# Patient Record
Sex: Male | Born: 1950
Health system: Southern US, Community
[De-identification: ages and names within clinical notes are randomized; demographics above are authoritative.]

## PROBLEM LIST (undated history)

## (undated) DIAGNOSIS — C649 Malignant neoplasm of unspecified kidney, except renal pelvis: Secondary | ICD-10-CM

## (undated) DIAGNOSIS — Z87820 Personal history of traumatic brain injury: Secondary | ICD-10-CM

## (undated) DIAGNOSIS — B182 Chronic viral hepatitis C: Secondary | ICD-10-CM

## (undated) DIAGNOSIS — I10 Essential (primary) hypertension: Secondary | ICD-10-CM

## (undated) DIAGNOSIS — M6281 Muscle weakness (generalized): Secondary | ICD-10-CM

## (undated) DIAGNOSIS — S062X9A Diffuse traumatic brain injury with loss of consciousness of unspecified duration, initial encounter: Secondary | ICD-10-CM

## (undated) DIAGNOSIS — Z1612 Extended spectrum beta lactamase (ESBL) resistance: Secondary | ICD-10-CM

## (undated) DIAGNOSIS — N390 Urinary tract infection, site not specified: Secondary | ICD-10-CM

## (undated) DIAGNOSIS — R41841 Cognitive communication deficit: Secondary | ICD-10-CM

## (undated) DIAGNOSIS — F329 Major depressive disorder, single episode, unspecified: Secondary | ICD-10-CM

## (undated) DIAGNOSIS — R310 Gross hematuria: Secondary | ICD-10-CM

## (undated) DIAGNOSIS — S062XAA Diffuse traumatic brain injury with loss of consciousness status unknown, initial encounter: Secondary | ICD-10-CM

## (undated) DIAGNOSIS — F1021 Alcohol dependence, in remission: Secondary | ICD-10-CM

## (undated) DIAGNOSIS — F17220 Nicotine dependence, chewing tobacco, uncomplicated: Secondary | ICD-10-CM

## (undated) DIAGNOSIS — E43 Unspecified severe protein-calorie malnutrition: Secondary | ICD-10-CM

## (undated) DIAGNOSIS — R1312 Dysphagia, oropharyngeal phase: Secondary | ICD-10-CM

## (undated) HISTORY — DX: Gross hematuria: R31.0

## (undated) HISTORY — DX: Muscle weakness (generalized): M62.81

## (undated) HISTORY — DX: Personal history of traumatic brain injury: Z87.820

## (undated) HISTORY — DX: Chronic viral hepatitis C: B18.2

## (undated) HISTORY — DX: Urinary tract infection, site not specified: N39.0

## (undated) HISTORY — DX: Malignant neoplasm of unspecified kidney, except renal pelvis: C64.9

## (undated) HISTORY — DX: Unspecified severe protein-calorie malnutrition: E43

## (undated) HISTORY — DX: Cognitive communication deficit: R41.841

## (undated) HISTORY — DX: Alcohol dependence, in remission: F10.21

## (undated) HISTORY — DX: Extended spectrum beta lactamase (ESBL) resistance: Z16.12

## (undated) HISTORY — DX: Nicotine dependence, chewing tobacco, uncomplicated: F17.220

## (undated) HISTORY — DX: Major depressive disorder, single episode, unspecified: F32.9

## (undated) HISTORY — DX: Dysphagia, oropharyngeal phase: R13.12

## (undated) HISTORY — DX: Essential (primary) hypertension: I10

## (undated) HISTORY — DX: Diffuse traumatic brain injury with loss of consciousness status unknown, initial encounter: S06.2XAA

## (undated) HISTORY — DX: Diffuse traumatic brain injury with loss of consciousness of unspecified duration, initial encounter: S06.2X9A

## (undated) HISTORY — PX: TESTICLE REMOVAL: SHX68

---

## 2010-09-09 ENCOUNTER — Inpatient Hospital Stay: Payer: Self-pay | Admitting: Psychiatry

## 2011-02-08 ENCOUNTER — Emergency Department: Payer: Self-pay | Admitting: Emergency Medicine

## 2012-02-01 DIAGNOSIS — F17200 Nicotine dependence, unspecified, uncomplicated: Secondary | ICD-10-CM | POA: Insufficient documentation

## 2013-02-12 ENCOUNTER — Emergency Department: Payer: Self-pay | Admitting: Emergency Medicine

## 2013-02-12 LAB — URINALYSIS, COMPLETE
Bacteria: NONE SEEN
Bilirubin,UR: NEGATIVE
Blood: NEGATIVE
Glucose,UR: NEGATIVE mg/dL (ref 0–75)
Leukocyte Esterase: NEGATIVE
Ph: 7 (ref 4.5–8.0)
Protein: NEGATIVE
RBC,UR: 1 /HPF (ref 0–5)
Specific Gravity: 1.01 (ref 1.003–1.030)
Squamous Epithelial: NONE SEEN
WBC UR: <1 /HPF (ref 0–5)

## 2014-11-16 DIAGNOSIS — B182 Chronic viral hepatitis C: Secondary | ICD-10-CM | POA: Insufficient documentation

## 2015-08-31 ENCOUNTER — Encounter: Payer: Self-pay | Admitting: Emergency Medicine

## 2015-08-31 ENCOUNTER — Emergency Department
Admission: EM | Admit: 2015-08-31 | Discharge: 2015-08-31 | Disposition: A | Discharge status: Home / Self Care | Payer: Medicaid Other | Attending: Emergency Medicine | Admitting: Emergency Medicine

## 2015-08-31 DIAGNOSIS — H6092 Unspecified otitis externa, left ear: Secondary | ICD-10-CM | POA: Diagnosis not present

## 2015-08-31 DIAGNOSIS — F17210 Nicotine dependence, cigarettes, uncomplicated: Secondary | ICD-10-CM | POA: Diagnosis not present

## 2015-08-31 DIAGNOSIS — H9202 Otalgia, left ear: Secondary | ICD-10-CM | POA: Diagnosis present

## 2015-08-31 MED ORDER — OXYCODONE-ACETAMINOPHEN 5-325 MG PO TABS: 1.0000 | ORAL_TABLET | ORAL | Status: DC | PRN

## 2015-08-31 MED ORDER — KETOROLAC TROMETHAMINE 60 MG/2ML IM SOLN
60.0000 mg | Freq: Once | INTRAMUSCULAR | Status: AC
  Administered 2015-08-31: 60 mg via INTRAMUSCULAR
  Filled 2015-08-31: qty 2

## 2015-08-31 MED ORDER — OXYCODONE-ACETAMINOPHEN 5-325 MG PO TABS
2.0000 | ORAL_TABLET | Freq: Once | ORAL | Status: AC
  Administered 2015-08-31: 2 via ORAL
  Filled 2015-08-31: qty 2

## 2015-08-31 MED ORDER — OFLOXACIN 0.3 % OP SOLN: 4.0000 [drp] | Freq: Two times a day (BID) | OPHTHALMIC | Status: DC

## 2015-08-31 NOTE — ED Notes (Signed)
Pt reports he has been having fevers at home and has been waking up wet from sweat for the past week. Pt able to ambulate independently to treatment room and is in NAD at this time.

## 2015-08-31 NOTE — Discharge Instructions (Signed)

## 2015-08-31 NOTE — ED Provider Notes (Signed)
[facility] Emergency Department Provider Note  ____________________________________________  Time seen: Approximately 11:45 AM  I have reviewed the triage vital signs and the nursing notes.   HISTORY  Chief Complaint Otalgia    HPI Keith Daniels is a 65 y.o. male is complaining of drainage coming from his left ear times one week. Patient states progressively getting worse having some congestion in his head difficulty hearing. Patient states he has not been swimming or develop. His like his whole face is hurting.   History reviewed. No pertinent past medical history.  There are no active problems to display for this patient.   History reviewed. No pertinent past surgical history.  Current Outpatient Rx  Name  Route  Sig  Dispense  Refill  . ofloxacin (OCUFLOX) 0.3 % ophthalmic solution   Left Ear   Place 4 drops into the left ear 2 (two) times daily.   5 mL   0   . oxyCODONE-acetaminophen (ROXICET) 5-325 MG tablet   Oral   Take 1-2 tablets by mouth every 4 (four) hours as needed for severe pain.   15 tablet   0     Allergies Review of patient's allergies indicates no known allergies.  No family history on file.  Social History Social History  Substance Use Topics  . Smoking status: Current Every Day Smoker -- 0.50 packs/day    Types: Cigarettes  . Smokeless tobacco: None  . Alcohol Use: Yes     Comment: occas.    Review of Systems Constitutional: No fever/chills ENT: Positive greenish discharge coming from left ear. Positive for left-sided facial pain. Cardiovascular: Denies chest pain. Respiratory: Denies shortness of breath. Musculoskeletal: Negative for back pain. Skin: Negative for rash. Neurological: Negative for headaches, focal weakness or numbness.  10-point ROS otherwise negative.  ____________________________________________   PHYSICAL EXAM:  VITAL SIGNS: ED Triage Vitals  Enc Vitals Group     BP 08/31/15  1051 142/109 mmHg     Pulse Rate 08/31/15 1050 61     Resp 08/31/15 1050 18     Temp 08/31/15 1050 98.9 F (37.2 C)     Temp Source 08/31/15 1050 Oral     SpO2 08/31/15 1050 99 %     Weight 08/31/15 1050 170 lb (77.111 kg)     Height 08/31/15 1050 6' (1.829 m)     Head Cir --      Peak Flow --      Pain Score 08/31/15 1050 10     Pain Loc --      Pain Edu? --      Excl. in [place]? --     Constitutional: Alert and oriented. Well appearing and in no acute distress. Head: Left ear with copious amounts of greenish discharge noted. Mouth/Throat: Mucous membranes are moist.  Oropharynx non-erythematous. Neck: No stridor.   Cardiovascular: Normal rate, regular rhythm. Grossly normal heart sounds.  Good peripheral circulation. Respiratory: Normal respiratory effort.  No retractions. Lungs CTAB. Musculoskeletal: No lower extremity tenderness nor edema.  No joint effusions. Neurologic:  Normal speech and language. No gross focal neurologic deficits are appreciated. No gait instability. Skin:  Skin is warm, dry and intact. No rash noted. Psychiatric: Mood and affect are normal. Speech and behavior are normal.  ____________________________________________   LABS (all labs ordered are listed, but only abnormal results are displayed)  Labs Reviewed - No data to display ____________________________________________  EKG   ____________________________________________  RADIOLOGY   ____________________________________________   PROCEDURES  Procedure(s)  performed: None  Critical Care performed: No  ____________________________________________   INITIAL IMPRESSION / ASSESSMENT AND PLAN / ED COURSE  Pertinent labs & imaging results that were available during my care of the patient were reviewed by me and considered in my medical decision making (see chart for details).  Acute otitis externa left ear. Ocuflox Rx drops given. Patient follow-up with PCP or return to the ER with any  worsening symptomology. ____________________________________________   FINAL CLINICAL IMPRESSION(S) / ED DIAGNOSES  Final diagnoses:  Otitis externa of left ear     This chart was dictated using voice recognition software/Dragon. Despite best efforts to proofread, errors can occur which can change the meaning. Any change was purely unintentional.   Arlyss Repress, PA-C 08/31/15 1346  Lavonia Drafts, MD 08/31/15 [phone]

## 2015-08-31 NOTE — ED Notes (Signed)
Congestion and pain in left ear.

## 2016-10-14 DIAGNOSIS — I10 Essential (primary) hypertension: Secondary | ICD-10-CM | POA: Insufficient documentation

## 2016-10-26 DIAGNOSIS — E870 Hyperosmolality and hypernatremia: Secondary | ICD-10-CM | POA: Insufficient documentation

## 2016-10-26 DIAGNOSIS — R9431 Abnormal electrocardiogram [ECG] [EKG]: Secondary | ICD-10-CM | POA: Insufficient documentation

## 2016-11-18 DIAGNOSIS — R410 Disorientation, unspecified: Secondary | ICD-10-CM | POA: Insufficient documentation

## 2016-11-25 DIAGNOSIS — S069X9A Unspecified intracranial injury with loss of consciousness of unspecified duration, initial encounter: Secondary | ICD-10-CM | POA: Insufficient documentation

## 2016-11-25 DIAGNOSIS — S069XAA Unspecified intracranial injury with loss of consciousness status unknown, initial encounter: Secondary | ICD-10-CM | POA: Insufficient documentation

## 2016-11-26 DIAGNOSIS — S069X9S Unspecified intracranial injury with loss of consciousness of unspecified duration, sequela: Secondary | ICD-10-CM

## 2016-11-26 DIAGNOSIS — F0280 Dementia in other diseases classified elsewhere without behavioral disturbance: Secondary | ICD-10-CM | POA: Insufficient documentation

## 2017-05-29 ENCOUNTER — Other Ambulatory Visit: Payer: Self-pay

## 2017-05-30 ENCOUNTER — Ambulatory Visit (INDEPENDENT_AMBULATORY_CARE_PROVIDER_SITE_OTHER): Payer: Medicare Other | Admitting: Gastroenterology

## 2017-05-30 ENCOUNTER — Encounter (INDEPENDENT_AMBULATORY_CARE_PROVIDER_SITE_OTHER): Payer: Self-pay

## 2017-05-30 ENCOUNTER — Ambulatory Visit: Payer: Medicaid Other | Admitting: Gastroenterology

## 2017-05-30 VITALS — BP 128/84 | HR 66 | Ht 72.0 in

## 2017-05-30 DIAGNOSIS — Z931 Gastrostomy status: Secondary | ICD-10-CM | POA: Diagnosis not present

## 2017-05-30 NOTE — Progress Notes (Signed)
Keith Daniels [address]  Keith Daniels  [place], [place] [address]  Main: [phone]  Fax: [phone]   Gastroenterology Consultation  Referring Provider:     [name], MD Primary Care Physician:  Keith Shivers, MD Primary Gastroenterologist:  Dr. [name] Reason for Consultation:    PEG tube removal        HPI:   Keith Daniels is a 67 y.o. y/o male referred for consultation & management  by Dr. [name], MD.  Patient sent from living facility, at [facility] health care, without proper records, and referred for PEG tube removal.  Patient is here with his brother.  I do not have any previous records about his PEG tube whatsoever.  There is no way of knowing why the PEG tube was placed, when it was placed, where it was placed, what specialty placed it.  I do not have any indication of why removal of the PEG tube is being recommended either.    These records need to be obtained and reviewed carefully, to determine how and when, and what specialty can appropriately move the PEG tube safely.  As per patient and family, the PEG tube was placed 3-4 months ago when he was in the hospital.  He they state he went there unconscious.  They do not know why he was unconscious at the time.  They state he is eating by mouth at the facility without difficulty, and that is why they are requesting PEG tube removal.  Past medical history: TBI, alcohol use   Prior to Admission medications   Medication Sig Start Date End Date Taking? Authorizing Provider  finasteride (PROSCAR) 5 MG tablet TK 1 T PO QD 07/17/14   [provider]  gabapentin (NEURONTIN) 400 MG capsule TK 1 C PO TID 08/01/14   [provider]  meloxicam (MOBIC) 7.5 MG tablet TK 1 T PO BID 07/24/14   [provider]  methocarbamol (ROBAXIN) 500 MG tablet TK 1 T PO TID PRN 10/05/14   [provider]  ofloxacin (OCUFLOX) 0.3 % ophthalmic solution Place 4 drops into the  left ear 2 (two) times daily. 08/31/15   Beers, [name], PA-C  oxyCODONE-acetaminophen (ROXICET) 5-325 MG tablet Take 1-2 tablets by mouth every 4 (four) hours as needed for severe pain. 08/31/15   Beers, [name], PA-C  tamsulosin (FLOMAX) 0.4 MG CAPS capsule TK 1 C PO QD 07/17/14   [provider]  tiotropium (SPIRIVA HANDIHALER) 18 MCG inhalation capsule INHALE CONTENTS OF 1 CAPSULE VIA HANDIHALER QD 10/05/14   [provider]    No family history on file.   Social History   Tobacco Use  . Smoking status: Current Every Day Smoker    Packs/day: 0.50    Types: Cigarettes  Substance Use Topics  . Alcohol use: Yes    Comment: occas.  . Drug use: Not on file    Allergies as of 05/30/2017  . (No Known Allergies)    Review of Systems:    All systems reviewed and negative except where noted in HPI.   Physical Exam:  BP 128/84   Pulse 66   Ht 6' (1.829 m)   BMI 23.06 kg/m  No LMP for male patient.   Psych:  Alert and cooperative. Normal mood and affect. General:   Alert,  Well-developed, well-nourished, pleasant and cooperative in NAD Head:  Normocephalic and atraumatic. Eyes:  Sclera clear, no icterus.   Conjunctiva pink. Ears:  Normal auditory acuity.  Nose:  No deformity, discharge, or lesions. Mouth:  No deformity or lesions,oropharynx pink & moist. Neck:  Supple; no masses or thyromegaly. Abdomen:  Normal bowel sounds.  No bruits.  Soft, non-tender and non-distended without masses, hepatosplenomegaly or hernias noted.  No guarding or rebound tenderness.    PEG tube in place, cylindrical fasteners in place alongside the PEG tube as well Msk:  Symmetrical without gross deformities. Good, equal movement & strength bilaterally. Pulses:  Normal pulses noted. Extremities:  No clubbing or edema.  No cyanosis. Psych:  Alert and cooperative. Normal mood and affect.   Labs: CBC No results found for: WBC, RBC, HGB, HCT, PLT, MCV, MCH, MCHC, RDW, LYMPHSABS,  MONOABS, EOSABS, BASOSABS CMP  No results found for: NA, K, CL, CO2, GLUCOSE, BUN, CREATININE, CALCIUM, PROT, ALBUMIN, AST, ALT, ALKPHOS, BILITOT, GFRNONAA, GFRAA  Imaging Studies: No results found.  Assessment and Plan:   Keith Daniels is a 67 y.o. y/o male has been referred for PEG tube removal without any records sent.  Prior to removal of any PEG tube, it is imperative, to review previous records about the PEG tube itself.  For example, when it was placed, where it was placed, who placed it, what kind of PEG tube it is, indication for placement, indication for removal.  Without this information, PEG tube removal is not recommended.  We do not have any of these records available to [place] at this time.  His paper records from his living facility was not sent with any of these records.  His electronic chart does not have any of this information under chart review or care everywhere, or the scanned chart.  This was discussed extensively with the patient and his family.  In addition, the PEG tube has fasteners placed around it, and it is not a type of PEG tube I place myself.  The PEG tube should be removed by the kind of specialty that placed it.  For example, if surgery to place the PEG tube, they should be the ones to remove it.  If GI placed a plastic tube, a GI physician can remove it.  If IR place a PEG tube, interventional radiology should then remove it.  This allows for safe removal and follow-up of the PEG tube site.   I have sent written recommendations to the facility, to ask them to review these records and refer the patient to the specific physician or specialty that placed the PEG tube in the first place.  I have requested them to send me these records as well, as I can assist in figuring this out as well.  Patient has previously been followed by [facility] clinic GI for hepatitis C.  He should follow-up with them, and this regard, in the near future as they were planning on  starting him on treatment and had initiated workup for this.   Above discussed with patient and family and they verbalized understanding.   Will await records  Dr Keith Daniels

## 2017-06-02 ENCOUNTER — Telehealth: Payer: Self-pay

## 2017-06-02 NOTE — Telephone Encounter (Signed)
Dr. Bonna Gains calls office and states she called [facility] today and pt has had peg tube removed. No further f/u needed.

## 2017-06-02 NOTE — Progress Notes (Signed)
I talked with Aldona Bar from the nursing home today. She states patient's PEG has been removed at the nursing home. She does now know by who. I have left a message with her requesting the physician at the facility to contact me and left my cell number with them. She said she will let them know. I have also told her that pt should follow up with [facility] clinic GI in regard to his Hep C treatment that they had planned previously. She said she will let the physician know as well.  I am trying to contact the physician to make these recommendations directly and have sent these recommendations in written form with the patient during his clinic visit as well.

## 2017-06-19 ENCOUNTER — Ambulatory Visit: Payer: Medicaid Other | Admitting: Gastroenterology

## 2018-01-23 ENCOUNTER — Ambulatory Visit (INDEPENDENT_AMBULATORY_CARE_PROVIDER_SITE_OTHER): Payer: Medicare Other | Admitting: Urology

## 2018-01-23 ENCOUNTER — Encounter: Payer: Self-pay | Admitting: Urology

## 2018-01-23 VITALS — BP 116/73 | HR 82

## 2018-01-23 DIAGNOSIS — R310 Gross hematuria: Secondary | ICD-10-CM | POA: Diagnosis not present

## 2018-01-23 LAB — URINALYSIS, COMPLETE
Bilirubin, UA: NEGATIVE
Glucose, UA: NEGATIVE
Nitrite, UA: POSITIVE — AB
PH UA: 6.5 (ref 5.0–7.5)
Specific Gravity, UA: 1.02 (ref 1.005–1.030)
Urobilinogen, Ur: 1 mg/dL (ref 0.2–1.0)

## 2018-01-23 LAB — MICROSCOPIC EXAMINATION
EPITHELIAL CELLS (NON RENAL): NONE SEEN /HPF (ref 0–10)
RBC, UA: >30 /hpf — ABNORMAL HIGH (ref 0–2)

## 2018-01-23 NOTE — Progress Notes (Signed)
01/23/2018 2:31 PM   Keith Daniels Post Aug 29, 1950 [id number]  Referring provider: [name], MD [place], [place] [address]  Chief Complaint  Patient presents with  . 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  He presented today without a family member or staff member present.  There is a handwritten consultation for evaluation of gross hematuria.  The patient states he has had blood in his urine for the past week.  He does complain of dysuria, frequency and urgency.  He was unable to give a voided specimen and was catheterized with return of grossly bloody urine.  Unable to discern if he has had prior urologic problems however he is on tamsulosin and finasteride.   PMH: No past medical history on file.  Surgical History: No past surgical history on file.  Home Medications:  Allergies as of 01/23/2018   No Known Allergies     Medication List        Accurate as of 01/23/18  2:31 PM. Always use your most recent med list.          diphenhydrAMINE 50 MG/ML injection Commonly known as:  BENADRYL Inject into the muscle.   divalproex 125 MG capsule Commonly known as:  DEPAKOTE SPRINKLE Take by mouth.   enoxaparin 40 MG/0.4ML injection Commonly known as:  LOVENOX Inject into the skin.   finasteride 5 MG tablet Commonly known as:  PROSCAR TK 1 T PO QD   gabapentin 400 MG capsule Commonly known as:  NEURONTIN TK 1 C PO TID   LORazepam 2 MG/ML injection Commonly known as:  ATIVAN Inject into the vein.   Melatonin 300 MCG Tabs Take by mouth.   meloxicam 7.5 MG tablet Commonly known as:  MOBIC TK 1 T PO BID   methocarbamol 500 MG tablet Commonly known as:  ROBAXIN TK 1 T PO TID PRN   nicotine 14 mg/24hr patch Commonly known as:  NICODERM CQ - dosed in mg/24 hours Place onto the skin.   ofloxacin 0.3 % ophthalmic solution Commonly known as:   OCUFLOX Place 4 drops into the left ear 2 (two) times daily.   oxyCODONE-acetaminophen 5-325 MG tablet Commonly known as:  PERCOCET/ROXICET Take 1-2 tablets by mouth every 4 (four) hours as needed for severe pain.   risperidone 3 MG disintegrating tablet Commonly known as:  RISPERDAL M-TABS Take by mouth.   SPIRIVA HANDIHALER 18 MCG inhalation capsule Generic drug:  tiotropium INHALE CONTENTS OF 1 CAPSULE VIA HANDIHALER QD   tamsulosin 0.4 MG Caps capsule Commonly known as:  FLOMAX TK 1 C PO QD       Allergies: No Known Allergies  Family History: No family history on file.  Social History:  reports that he has been smoking cigarettes. He has been smoking about 0.50 packs per day. He has never used smokeless tobacco. He reports that he drinks alcohol. He reports that he has current or past drug history.  ROS: UROLOGY Frequent Urination?: No Hard to postpone urination?: No Burning/pain with urination?: No Get up at night to urinate?: No Leakage of urine?: No Urine stream starts and stops?: No Trouble starting stream?: No Do you have to strain to urinate?: No Blood in urine?: Yes Urinary tract infection?: No Sexually transmitted disease?: No Injury to kidneys or bladder?: No Painful intercourse?: No Weak stream?: No Erection problems?: No Penile pain?: No  Gastrointestinal Nausea?: No Vomiting?: No Indigestion/heartburn?: No  Diarrhea?: No Constipation?: No  Constitutional Fever: No Night sweats?: No Weight loss?: No Fatigue?: No  Skin Skin rash/lesions?: No Itching?: No  Eyes Blurred vision?: No Double vision?: No  Ears/Nose/Throat Sore throat?: No Sinus problems?: No  Hematologic/Lymphatic Swollen glands?: No Easy bruising?: No  Cardiovascular Leg swelling?: No Chest pain?: No  Respiratory Cough?: No Shortness of breath?: No  Endocrine Excessive thirst?: No  Musculoskeletal Back pain?: No Joint pain?:  No  Neurological Headaches?: No Dizziness?: No  Psychologic Depression?: No Anxiety?: No  Physical Exam: BP 116/73 (BP Location: Left Arm, Patient Position: Sitting, Cuff Size: Normal)   Pulse 82   Constitutional:  Alert, No acute distress. HEENT: [place] AT, moist mucus membranes.  Trachea midline, no masses. Cardiovascular: No clubbing, cyanosis, or edema. Respiratory: Normal respiratory effort, no increased work of breathing. GI: Abdomen is soft, nontender, nondistended, no abdominal masses GU: No CVA tenderness Lymph: No cervical or inguinal lymphadenopathy. Skin: No rashes, bruises or suspicious lesions. Neurologic: Grossly intact, no focal deficits, moving all 4 extremities. Psychiatric: Normal mood and affect.   Assessment & Plan:   67 year old male with gross hematuria.  The catheterized urine was sent for culture.  Will wait on these results before further evaluation.  If negative would recommend CT urogram and cystoscopy.  If positive for infection would recommend renal ultrasound initially.   Abbie Sons, [place] [address], [place], [place] [address] [phone]

## 2018-02-05 ENCOUNTER — Telehealth: Payer: Self-pay | Admitting: Urology

## 2018-02-05 DIAGNOSIS — R310 Gross hematuria: Secondary | ICD-10-CM

## 2018-02-05 NOTE — Telephone Encounter (Signed)
Bimpe is a NP from [facility] calling for the results of the pts Urine Culture, Please advise Bimpe of results at [phone]. Thank you.

## 2018-02-07 NOTE — Telephone Encounter (Signed)
I called and spoke with Bimpe, the NP at [facility]. She is going to run a urine culture on the patient since our urine culture was not sent out. She will fax over results as soon as she has them for [place] to discuss.

## 2018-02-12 ENCOUNTER — Other Ambulatory Visit: Payer: Self-pay

## 2018-02-12 MED ORDER — CEFDINIR 300 MG PO CAPS: 300.0000 mg | ORAL_CAPSULE | Freq: Two times a day (BID) | ORAL | 0 refills | Status: AC

## 2018-02-12 NOTE — Telephone Encounter (Signed)
I called and spoke with Bimpe at [facility]. Per Dr. Bernardo Heater, pt was started on Omnicef 300mg  bid for 10 days.  I have informed Bimpe and faxed over the rx. Per your office notes, since pt had a positive infection, you wanted to do a renal ultrasound. I have placed the order, can you please sign.

## 2018-02-12 NOTE — Addendum Note (Signed)
Addended by: Garnette Gunner on: 02/12/2018 11:12 AM   Modules accepted: Orders

## 2018-02-20 ENCOUNTER — Ambulatory Visit
Admission: RE | Admit: 2018-02-20 | Discharge: 2018-02-20 | Disposition: A | Discharge status: Home / Self Care | Payer: Medicare Other | Source: Ambulatory Visit | Attending: Urology | Admitting: Urology

## 2018-02-20 DIAGNOSIS — R39198 Other difficulties with micturition: Secondary | ICD-10-CM | POA: Insufficient documentation

## 2018-02-20 DIAGNOSIS — R310 Gross hematuria: Secondary | ICD-10-CM | POA: Diagnosis present

## 2018-02-21 ENCOUNTER — Other Ambulatory Visit: Payer: Self-pay | Admitting: Urology

## 2018-02-21 ENCOUNTER — Telehealth: Payer: Self-pay

## 2018-02-21 DIAGNOSIS — R310 Gross hematuria: Secondary | ICD-10-CM

## 2018-02-21 NOTE — Telephone Encounter (Signed)
-----   Message from Abbie Sons, MD sent at 02/21/2018 12:28 PM EST ----- Renal ultrasound showed a possible renal mass and left hydronephrosis.  Recommend scheduling a CT urogram.  He has a traumatic brain injury and is at [place] [facility].  Order was entered.

## 2018-02-21 NOTE — Telephone Encounter (Signed)
[place] spoke with pt's nurse Anderson Malta informed her of the results below. Also had results faxed to [phone].

## 2018-03-09 ENCOUNTER — Ambulatory Visit
Admission: RE | Admit: 2018-03-09 | Discharge: 2018-03-09 | Disposition: A | Discharge status: Home / Self Care | Payer: Medicare Other | Source: Ambulatory Visit | Attending: Urology | Admitting: Urology

## 2018-03-09 DIAGNOSIS — R310 Gross hematuria: Secondary | ICD-10-CM | POA: Diagnosis present

## 2018-03-09 LAB — POCT I-STAT CREATININE: CREATININE: 1.3 mg/dL — AB (ref 0.61–1.24)

## 2018-03-09 MED ORDER — IOPAMIDOL (ISOVUE-300) INJECTION 61%
100.0000 mL | Freq: Once | INTRAVENOUS | Status: AC | PRN
  Administered 2018-03-09: 100 mL via INTRAVENOUS

## 2018-03-14 ENCOUNTER — Telehealth: Payer: Self-pay

## 2018-03-14 DIAGNOSIS — C642 Malignant neoplasm of left kidney, except renal pelvis: Secondary | ICD-10-CM

## 2018-03-14 NOTE — Telephone Encounter (Signed)
Spoke with Jenny Reichmann patient's POA he said he is coming to visit next week and could make an appointment then to discuss or you may call him at [phone]

## 2018-03-14 NOTE — Telephone Encounter (Signed)
-----   Message from Abbie Sons, MD sent at 03/13/2018  8:42 PM EST ----- This patient is a resident of [facility] and has a traumatic brain injury.  His CT scan was abnormal.  I need to try to get in touch with his healthcare power of attorney regarding the results.

## 2018-03-16 NOTE — Telephone Encounter (Signed)
Pt's POA, John, has some questions about pt's care.  Can you please contact him?

## 2018-03-16 NOTE — Telephone Encounter (Signed)
I spoke with patient's POA Cheri Guppy regarding his CT result.  He has a large soft tissue mass in the left renal pelvis that appears to involve the lower pole of the left kidney and consistent with extensive urothelial carcinoma.  There is extensive retroperitoneal lymphadenopathy the largest lymph node measuring 3.7 cm.  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  Based on his overall comorbidities I have recommended an oncology appointment for further evaluation and consideration of percutaneous lymph node biopsy by IR if feasible.  The option of ureteroscopy and biopsy of renal pelvic mass was also discussed.  He will most likely be presented at oncology case conference.  Referral was placed to medical oncology.

## 2018-04-03 ENCOUNTER — Inpatient Hospital Stay: Payer: Medicare Other

## 2018-04-03 ENCOUNTER — Other Ambulatory Visit: Payer: Self-pay

## 2018-04-03 ENCOUNTER — Inpatient Hospital Stay: Payer: Medicare Other | Attending: Oncology | Admitting: Oncology

## 2018-04-03 ENCOUNTER — Encounter: Payer: Self-pay | Admitting: Oncology

## 2018-04-03 VITALS — BP 134/86 | HR 70 | Temp 96.3°F | Resp 18

## 2018-04-03 DIAGNOSIS — Z87891 Personal history of nicotine dependence: Secondary | ICD-10-CM | POA: Diagnosis not present

## 2018-04-03 DIAGNOSIS — Z79899 Other long term (current) drug therapy: Secondary | ICD-10-CM | POA: Insufficient documentation

## 2018-04-03 DIAGNOSIS — D649 Anemia, unspecified: Secondary | ICD-10-CM | POA: Diagnosis not present

## 2018-04-03 DIAGNOSIS — N2889 Other specified disorders of kidney and ureter: Secondary | ICD-10-CM | POA: Diagnosis not present

## 2018-04-03 DIAGNOSIS — I700 Atherosclerosis of aorta: Secondary | ICD-10-CM | POA: Diagnosis not present

## 2018-04-03 DIAGNOSIS — Z87820 Personal history of traumatic brain injury: Secondary | ICD-10-CM | POA: Insufficient documentation

## 2018-04-03 DIAGNOSIS — R590 Localized enlarged lymph nodes: Secondary | ICD-10-CM

## 2018-04-03 DIAGNOSIS — F1021 Alcohol dependence, in remission: Secondary | ICD-10-CM | POA: Insufficient documentation

## 2018-04-03 DIAGNOSIS — R300 Dysuria: Secondary | ICD-10-CM | POA: Diagnosis not present

## 2018-04-03 DIAGNOSIS — I10 Essential (primary) hypertension: Secondary | ICD-10-CM | POA: Insufficient documentation

## 2018-04-03 DIAGNOSIS — F329 Major depressive disorder, single episode, unspecified: Secondary | ICD-10-CM | POA: Insufficient documentation

## 2018-04-03 DIAGNOSIS — D72819 Decreased white blood cell count, unspecified: Secondary | ICD-10-CM | POA: Insufficient documentation

## 2018-04-03 DIAGNOSIS — C642 Malignant neoplasm of left kidney, except renal pelvis: Secondary | ICD-10-CM | POA: Diagnosis present

## 2018-04-03 DIAGNOSIS — R319 Hematuria, unspecified: Secondary | ICD-10-CM | POA: Diagnosis not present

## 2018-04-03 LAB — CBC WITH DIFFERENTIAL/PLATELET
Abs Immature Granulocytes: 0 10*3/uL (ref 0.00–0.07)
Basophils Absolute: 0 10*3/uL (ref 0.0–0.1)
Basophils Relative: 1 %
Eosinophils Absolute: 0.1 10*3/uL (ref 0.0–0.5)
Eosinophils Relative: 5 %
HCT: 37.2 % — ABNORMAL LOW (ref 39.0–52.0)
Hemoglobin: 11.8 g/dL — ABNORMAL LOW (ref 13.0–17.0)
IMMATURE GRANULOCYTES: 0 %
Lymphocytes Relative: 32 %
Lymphs Abs: 0.9 10*3/uL (ref 0.7–4.0)
MCH: 27.4 pg (ref 26.0–34.0)
MCHC: 31.7 g/dL (ref 30.0–36.0)
MCV: 86.3 fL (ref 80.0–100.0)
Monocytes Absolute: 0.3 10*3/uL (ref 0.1–1.0)
Monocytes Relative: 11 %
NRBC: 0 % (ref 0.0–0.2)
Neutro Abs: 1.4 10*3/uL — ABNORMAL LOW (ref 1.7–7.7)
Neutrophils Relative %: 51 %
Platelets: 176 10*3/uL (ref 150–400)
RBC: 4.31 MIL/uL (ref 4.22–5.81)
RDW: 14.6 % (ref 11.5–15.5)
WBC: 2.7 10*3/uL — ABNORMAL LOW (ref 4.0–10.5)

## 2018-04-03 LAB — PROTIME-INR
INR: 1.08
PROTHROMBIN TIME: 13.9 s (ref 11.4–15.2)

## 2018-04-03 LAB — COMPREHENSIVE METABOLIC PANEL
ALT: 12 U/L (ref 0–44)
AST: 20 U/L (ref 15–41)
Albumin: 4 g/dL (ref 3.5–5.0)
Alkaline Phosphatase: 79 U/L (ref 38–126)
Anion gap: 7 (ref 5–15)
BUN: 12 mg/dL (ref 8–23)
CHLORIDE: 106 mmol/L (ref 98–111)
CO2: 28 mmol/L (ref 22–32)
Calcium: 10.9 mg/dL — ABNORMAL HIGH (ref 8.9–10.3)
Creatinine, Ser: 1.17 mg/dL (ref 0.61–1.24)
GFR calc Af Amer: >60 mL/min (ref >60)
GFR calc non Af Amer: >60 mL/min (ref >60)
Glucose, Bld: 133 mg/dL — ABNORMAL HIGH (ref 70–99)
Potassium: 4.1 mmol/L (ref 3.5–5.1)
Sodium: 141 mmol/L (ref 135–145)
Total Bilirubin: 0.5 mg/dL (ref 0.3–1.2)
Total Protein: 7.9 g/dL (ref 6.5–8.1)

## 2018-04-03 LAB — APTT: aPTT: 35 seconds (ref 24–36)

## 2018-04-03 LAB — LACTATE DEHYDROGENASE: LDH: 125 U/L (ref 98–192)

## 2018-04-03 NOTE — Progress Notes (Signed)
Patient here for initial visit. Brother present with patient. He states that he is on IV medication for UTI and ever since he started his appetite is gone. He is currently at [facility]. Medication reconciliation done through faclity [facility]

## 2018-04-03 NOTE — Progress Notes (Signed)
Hematology/Oncology Consult note [facility] [phone] [phone]   Patient Care Team: [name], MD as PCP - General (Family Medicine)  REFERRING PROVIDER: Dr.Stoiff CHIEF COMPLAINTS/REASON FOR VISIT:  Evaluation of renal mass. 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  Patient has history of traumatic brain injury long term resident at [facility]. History of HCV, HTN, Alcohol abuse, IVDU.  Former smoker.  Patient was evaluated by Urology Dr.Stoiff due to hematuria for the past few weeks, associated with dysuria, increased frequency and urgency. Patient was treated with Omnicef 300mg  BID x 10 days empirically. Urine culture later ESBL, microbiology results not available to me. Per [facility] records, patient has been on Imipenem 500mg  Q6 hours. Per patient, he is finished the course today.   02/20/2018 US renal showed Abnormal appearance of the lower pole of the right kidney suspicious for a mass either neoplastic or infectious. There is moderate hydronephrosis on the left. Renal protocol MRI would be a useful next imaging step. Normal appearing right kidney. The urinary bladder has a small postvoid residual volume.  03/09/2018 CT hematuria work up showed 1. Large mass in the left renal pelvis involving the lower and interpolar regions of the left kidney, with some invasion of the renal parenchyma in the lower pole of the left kidney, highly concerning for primary urothelial neoplasm. This is associated with extensive retroperitoneal lymphadenopathy, most evident in the left para-aortic nodal station adjacent to the left renal hilum, as above. Filling defects in the urinary bladder discussed above, favored to represent clots, although additional foci of  urothelial neoplasm is not entirely excluded. 2. Aortic atherosclerosis. 3. Additional incidental findings, as above.  Patient was referred to cancer center for further evaluation.  Patient reports feeling weak and fatigue for past few months. His has poor appetite and has had weight loss. Also reports generalized pain, not able to verbalize details.  Accompanied by brother Enid Derry who is his POA  Review of Systems  Constitutional: Positive for fatigue. Negative for appetite change, chills, fever and unexpected weight change.  HENT:   Negative for hearing loss and voice change.   Eyes: Negative for eye problems and icterus.  Respiratory: Negative for chest tightness, cough and shortness of breath.   Cardiovascular: Negative for chest pain and leg swelling.  Gastrointestinal: Negative for abdominal distention and abdominal pain.  Endocrine: Negative for hot flashes.  Genitourinary: Negative for difficulty urinating and dysuria.        Left flank discomfort  Musculoskeletal: Negative for arthralgias.  Skin: Negative for itching and rash.  Neurological: Negative for light-headedness and numbness.  Hematological: Negative for adenopathy. Does not bruise/bleed easily.  Psychiatric/Behavioral: Negative for confusion.    MEDICAL HISTORY:  Past Medical History:  Diagnosis Date  . Alcohol dependence in remission ([place])   . Chewing tobacco nicotine dependence, uncomplicated   . Chronic viral hepatitis C ([place])   . Cognitive communication deficit   . Diffuse traumatic brain injury ([place])   . Dysphagia, oropharyngeal phase   . Essential hypertension   . Extended spectrum beta lactamase (ESBL) resistance   . Gross hematuria   . History of traumatic brain injury   . Major depressive disorder   . Muscle weakness (generalized)   . Unspecified severe protein-calorie malnutrition ([place])   . Urinary  tract infection, site not specified     SURGICAL HISTORY: History reviewed. No pertinent surgical  history.  SOCIAL HISTORY: Social History   Socioeconomic History  . Marital status: Single    Spouse name: Not on file  . Number of children: Not on file  . Years of education: Not on file  . Highest education level: Not on file  Occupational History  . Not on file  Social Needs  . Financial resource strain: Not on file  . Food insecurity:    Worry: Not on file    Inability: Not on file  . Transportation needs:    Medical: Not on file    Non-medical: Not on file  Tobacco Use  . Smoking status: Former Smoker    Packs/day: 1.00    Years: 10.00    Pack years: 10.00    Types: Cigarettes    Last attempt to quit: 04/03/2017    Years since quitting: 1.0  . Smokeless tobacco: Never Used  Substance and Sexual Activity  . Alcohol use: Not Currently    Comment: quit in 2019  . Drug use: Not Currently    Types: "Crack" cocaine    Comment: quit in 2019  . Sexual activity: Yes  Lifestyle  . Physical activity:    Days per week: Not on file    Minutes per session: Not on file  . Stress: Not on file  Relationships  . Social connections:    Talks on phone: Not on file    Gets together: Not on file    Attends religious service: Not on file    Active member of club or organization: Not on file    Attends meetings of clubs or organizations: Not on file    Relationship status: Not on file  . Intimate partner violence:    Fear of current or ex partner: Not on file    Emotionally abused: Not on file    Physically abused: Not on file    Forced sexual activity: Not on file  Other Topics Concern  . Not on file  Social History Narrative  . Not on file    FAMILY HISTORY: Family History  Problem Relation Age of Onset  . Kidney disease Mother   . Hypertension Brother   . Prostate cancer Brother     ALLERGIES:  has No Known Allergies.  MEDICATIONS:  Current Outpatient Medications  Medication Sig Dispense Refill  . escitalopram (LEXAPRO) 10 MG tablet Take 10 mg by mouth daily.     [name] imipenem-cilastatin (PRIMAXIN) 500 MG injection Inject 500 mg into the vein every 6 (six) hours.    . Melatonin 300 MCG TABS Take 1.5 tablets by mouth at bedtime.     . MULTIPLE VITAMIN PO Take 1 tablet by mouth daily.    [name] thiamine (VITAMIN B-1) 100 MG tablet Take 100 mg by mouth daily.    . traMADol (ULTRAM) 50 MG tablet Take by mouth every 12 (twelve) hours as needed.    . diphenhydrAMINE (BENADRYL) 50 MG/ML injection Inject into the muscle.    . divalproex (DEPAKOTE SPRINKLE) 125 MG capsule Take by mouth.    . enoxaparin (LOVENOX) 40 MG/0.4ML injection Inject into the skin.    . finasteride (PROSCAR) 5 MG tablet TK 1 T PO QD    . gabapentin (NEURONTIN) 400 MG capsule TK 1 C PO TID    . LORazepam (ATIVAN) 2 MG/ML injection Inject into the vein.    . meloxicam (MOBIC) 7.5 MG  tablet TK 1 T PO BID    . methocarbamol (ROBAXIN) 500 MG tablet TK 1 T PO TID PRN    . nicotine (NICODERM CQ - DOSED IN MG/24 HOURS) 14 mg/24hr patch Place onto the skin.    [name] ofloxacin (OCUFLOX) 0.3 % ophthalmic solution Place 4 drops into the left ear 2 (two) times daily. (Patient not taking: Reported on 04/03/2018) 5 mL 0  . oxyCODONE-acetaminophen (ROXICET) 5-325 MG tablet Take 1-2 tablets by mouth every 4 (four) hours as needed for severe pain. (Patient not taking: Reported on 04/03/2018) 15 tablet 0  . risperidone (RISPERDAL M-TABS) 3 MG disintegrating tablet Take by mouth.    . tamsulosin (FLOMAX) 0.4 MG CAPS capsule TK 1 C PO QD    . tiotropium (SPIRIVA HANDIHALER) 18 MCG inhalation capsule INHALE CONTENTS OF 1 CAPSULE VIA HANDIHALER QD     No current facility-administered medications for this visit.      PHYSICAL EXAMINATION: ECOG PERFORMANCE STATUS: 3 - Symptomatic, >50% confined to bed Vitals:   04/03/18 1120  BP: 134/86  Pulse: 70  Resp: 18  Temp: (!) 96.3 F (35.7 C)   Filed Weights    Physical Exam Constitutional:      General: He is not in acute distress.    Appearance: He is  ill-appearing.  HENT:     Head: Normocephalic.  Eyes:     General: No scleral icterus.    Pupils: Pupils are equal, round, and reactive to light.  Neck:     Musculoskeletal: Normal range of motion and neck supple.  Cardiovascular:     Rate and Rhythm: Normal rate and regular rhythm.     Heart sounds: Normal heart sounds.  Pulmonary:     Effort: Pulmonary effort is normal. No respiratory distress.     Breath sounds: No wheezing.  Abdominal:     General: Bowel sounds are normal. There is no distension.     Palpations: Abdomen is soft. There is no mass.  Musculoskeletal: Normal range of motion.        General: No deformity.     Comments: Decreased bilateral lower extremity muscle strength. 2-3/5  Skin:    General: Skin is warm and dry.     Findings: No erythema or rash.  Neurological:     Mental Status: He is alert and oriented to person, place, and time.     Cranial Nerves: No cranial nerve deficit.     Coordination: Coordination normal.  Psychiatric:        Behavior: Behavior normal.        Thought Content: Thought content normal.      LABORATORY DATA:  I have reviewed the data as listed Lab Results  Component Value Date   WBC 2.7 (L) 04/03/2018   HGB 11.8 (L) 04/03/2018   HCT 37.2 (L) 04/03/2018   MCV 86.3 04/03/2018   PLT 176 04/03/2018   Recent Labs    03/09/18 1353 04/03/18 1206  NA  --  141  K  --  4.1  CL  --  106  CO2  --  28  GLUCOSE  --  133*  BUN  --  12  CREATININE 1.30* 1.17  CALCIUM  --  10.9*  GFRNONAA  --  >60  GFRAA  --  >60  PROT  --  7.9  ALBUMIN  --  4.0  AST  --  20  ALT  --  12  ALKPHOS  --  79  BILITOT  --  0.5   Iron/TIBC/Ferritin/ %Sat No results found for: IRON, TIBC, FERRITIN, IRONPCTSAT      ASSESSMENT & PLAN:  1. Left kidney mass   2. Retroperitoneal lymphadenopathy   3. Hypercalcemia   4. Normocytic anemia    # Renal ultrasound and CT hematuria were independantly reviewed and discussed with patient and his  brother/POA.  Findings are suspicious for malignancy, ie urothelia carcinoma of kidney.  Recommend obtaining tissue sampling to establish diagnosis.  Check CBC, CMP, LDH. Obtain CT guided biopsy  # History of chronic hepatitis, check CMP and Coags.  # Hypercalcemia, likely due to malignancy. Will arrange patient to receive Zometa 4mg  IV x 1.  # Leukopenia, differential showed mild neutropenia.  Reviewed patient's previous labs at [facility] via care everywhere. Chronic leukopenia.    Orders Placed This Encounter  Procedures  . CT Biopsy    Standing Status:   Future    Standing Expiration Date:   04/03/2019    Order Specific Question:   Lab orders requested (DO NOT place separate lab orders, these will be automatically ordered during procedure specimen collection):    Answer:   Surgical Pathology    Order Specific Question:   Reason for Exam (SYMPTOM  OR DIAGNOSIS REQUIRED)    Answer:   left kidney mass and retroperitoneal biopsy, whichever is feasible for biopsy.    Order Specific Question:   Preferred imaging location?    Answer:   [facility]    Order Specific Question:   Radiology Contrast Protocol - do NOT remove file path    Answer:   [email or web address]  . CBC with Differential/Platelet    Standing Status:   Future    Number of Occurrences:   1    Standing Expiration Date:   04/04/2019  . Comprehensive metabolic panel    Standing Status:   Future    Number of Occurrences:   1    Standing Expiration Date:   04/04/2019  . Lactate dehydrogenase    Standing Status:   Future    Number of Occurrences:   1    Standing Expiration Date:   04/04/2019  . Protime-INR    Standing Status:   Future    Number of Occurrences:   1    Standing Expiration Date:   04/04/2019  . APTT    Standing Status:   Future    Number of Occurrences:   1    Standing Expiration Date:   04/04/2019    All questions were answered. The patient knows to call the clinic with any problems  questions or concerns.  Return of visit: to be determined.  Thank you for this kind referral and the opportunity to participate in the care of this patient. A copy of today's note is routed to referring provider  Total face to face encounter time for this patient visit was 70 min. >50% of the time was  spent in counseling and coordination of care.    Earlie Server, MD, PhD Hematology Oncology [facility] Pager- [phone] 04/03/2018

## 2018-04-04 ENCOUNTER — Telehealth: Payer: Self-pay | Admitting: Urology

## 2018-04-04 NOTE — Telephone Encounter (Signed)
App canceled

## 2018-04-04 NOTE — Telephone Encounter (Signed)
-----   Message from Abbie Sons, MD sent at 04/04/2018  9:40 AM EST ----- This was the patient only 1/13 appointment that can be canceled.

## 2018-04-09 ENCOUNTER — Encounter (INDEPENDENT_AMBULATORY_CARE_PROVIDER_SITE_OTHER): Payer: Self-pay

## 2018-04-09 ENCOUNTER — Inpatient Hospital Stay: Payer: Medicare Other

## 2018-04-09 ENCOUNTER — Ambulatory Visit: Payer: Medicare Other | Admitting: Urology

## 2018-04-09 DIAGNOSIS — C642 Malignant neoplasm of left kidney, except renal pelvis: Secondary | ICD-10-CM | POA: Diagnosis not present

## 2018-04-09 MED ORDER — ZOLEDRONIC ACID 4 MG/100ML IV SOLN
4.0000 mg | Freq: Once | INTRAVENOUS | Status: AC
  Administered 2018-04-09: 4 mg via INTRAVENOUS
  Filled 2018-04-09: qty 100

## 2018-04-09 MED ORDER — SODIUM CHLORIDE 0.9 % IV SOLN
Freq: Once | INTRAVENOUS | Status: AC
  Administered 2018-04-09: 14:00:00 via INTRAVENOUS
  Filled 2018-04-09: qty 250

## 2018-04-09 NOTE — Progress Notes (Signed)
Patient came to clinic today from [facility] with peripheral IV(yellow catheter) saline lock in place to left arm. Patient states, "The IV is hurting me. Can you please take the IV out? It is time for it to come out." Dressing covering IV site is visibly soiled with dried blood underneath dressing. Dressing and peripheral IV site removed at this time. Post-removal- skin surrounding insertion site was clean, dry, and intact.

## 2018-04-13 ENCOUNTER — Other Ambulatory Visit: Payer: Self-pay | Admitting: Radiology

## 2018-04-16 ENCOUNTER — Ambulatory Visit
Admission: RE | Admit: 2018-04-16 | Discharge: 2018-04-16 | Disposition: A | Discharge status: Home / Self Care | Payer: Medicare Other | Source: Ambulatory Visit | Attending: Oncology | Admitting: Oncology

## 2018-04-16 ENCOUNTER — Other Ambulatory Visit: Payer: Self-pay

## 2018-04-16 ENCOUNTER — Ambulatory Visit: Admission: RE | Admit: 2018-04-16 | Payer: Medicare Other | Source: Ambulatory Visit

## 2018-04-16 DIAGNOSIS — Z8249 Family history of ischemic heart disease and other diseases of the circulatory system: Secondary | ICD-10-CM | POA: Diagnosis not present

## 2018-04-16 DIAGNOSIS — Z8042 Family history of malignant neoplasm of prostate: Secondary | ICD-10-CM | POA: Diagnosis not present

## 2018-04-16 DIAGNOSIS — Z8619 Personal history of other infectious and parasitic diseases: Secondary | ICD-10-CM | POA: Insufficient documentation

## 2018-04-16 DIAGNOSIS — F1021 Alcohol dependence, in remission: Secondary | ICD-10-CM | POA: Diagnosis not present

## 2018-04-16 DIAGNOSIS — C801 Malignant (primary) neoplasm, unspecified: Secondary | ICD-10-CM | POA: Diagnosis not present

## 2018-04-16 DIAGNOSIS — Z87820 Personal history of traumatic brain injury: Secondary | ICD-10-CM | POA: Diagnosis not present

## 2018-04-16 DIAGNOSIS — Z7901 Long term (current) use of anticoagulants: Secondary | ICD-10-CM | POA: Insufficient documentation

## 2018-04-16 DIAGNOSIS — Z79899 Other long term (current) drug therapy: Secondary | ICD-10-CM | POA: Diagnosis not present

## 2018-04-16 DIAGNOSIS — C772 Secondary and unspecified malignant neoplasm of intra-abdominal lymph nodes: Secondary | ICD-10-CM | POA: Insufficient documentation

## 2018-04-16 DIAGNOSIS — N2889 Other specified disorders of kidney and ureter: Secondary | ICD-10-CM | POA: Diagnosis not present

## 2018-04-16 DIAGNOSIS — R41841 Cognitive communication deficit: Secondary | ICD-10-CM | POA: Insufficient documentation

## 2018-04-16 DIAGNOSIS — Z841 Family history of disorders of kidney and ureter: Secondary | ICD-10-CM | POA: Insufficient documentation

## 2018-04-16 DIAGNOSIS — Z87891 Personal history of nicotine dependence: Secondary | ICD-10-CM | POA: Diagnosis not present

## 2018-04-16 DIAGNOSIS — R590 Localized enlarged lymph nodes: Secondary | ICD-10-CM

## 2018-04-16 DIAGNOSIS — I10 Essential (primary) hypertension: Secondary | ICD-10-CM | POA: Diagnosis not present

## 2018-04-16 LAB — CBC
HEMATOCRIT: 34.2 % — AB (ref 39.0–52.0)
Hemoglobin: 11.2 g/dL — ABNORMAL LOW (ref 13.0–17.0)
MCH: 28.1 pg (ref 26.0–34.0)
MCHC: 32.7 g/dL (ref 30.0–36.0)
MCV: 85.7 fL (ref 80.0–100.0)
Platelets: 211 10*3/uL (ref 150–400)
RBC: 3.99 MIL/uL — ABNORMAL LOW (ref 4.22–5.81)
RDW: 14.8 % (ref 11.5–15.5)
WBC: 2.7 10*3/uL — ABNORMAL LOW (ref 4.0–10.5)
nRBC: 0 % (ref 0.0–0.2)

## 2018-04-16 MED ORDER — HYDRALAZINE HCL 20 MG/ML IJ SOLN
INTRAMUSCULAR | Status: AC
  Filled 2018-04-16: qty 1

## 2018-04-16 MED ORDER — FENTANYL CITRATE (PF) 100 MCG/2ML IJ SOLN
INTRAMUSCULAR | Status: AC
  Filled 2018-04-16: qty 4

## 2018-04-16 MED ORDER — MIDAZOLAM HCL 5 MG/5ML IJ SOLN
INTRAMUSCULAR | Status: AC | PRN
  Administered 2018-04-16 (×3): 1 mg via INTRAVENOUS

## 2018-04-16 MED ORDER — FENTANYL CITRATE (PF) 100 MCG/2ML IJ SOLN
INTRAMUSCULAR | Status: AC | PRN
  Administered 2018-04-16: 50 ug via INTRAVENOUS

## 2018-04-16 MED ORDER — HYDRALAZINE HCL 20 MG/ML IJ SOLN
INTRAMUSCULAR | Status: AC | PRN
  Administered 2018-04-16: 10 mg via INTRAVENOUS

## 2018-04-16 MED ORDER — MIDAZOLAM HCL 5 MG/5ML IJ SOLN
INTRAMUSCULAR | Status: AC
  Filled 2018-04-16: qty 5

## 2018-04-16 MED ORDER — SODIUM CHLORIDE 0.9 % IV SOLN
INTRAVENOUS | Status: DC
  Administered 2018-04-16: 10:00:00 via INTRAVENOUS

## 2018-04-16 NOTE — H&P (Signed)
Chief Complaint: Lymphadenopathy  Referring Physician(s): Yu,Zhou  Supervising Physician: Aletta Edouard  Patient Status: [facility] - Out-pt  History of Present Illness: Keith Daniels is a 68 y.o. male with a history of traumatic brain injury long term resident at [facility]. History of HCV, HTN, Alcohol abuse, IVDU.   He was evaluated by Dr.Stoiff with urology for hematuria, dysuria, increased frequency and urgency.   Renal US done11/26/2019 showed abnormal appearance of the lower pole of the right kidney suspicious for a mass either neoplastic or infectious.   CT scan done 03/09/2018 showed= Large mass in the left renal pelvis involving the lower and interpolar regions of the left kidney, with some invasion of the renal parenchyma in the lower pole of the left kidney, highly concerning for primary urothelial neoplasm. This is associated with extensive retroperitoneal lymphadenopathy, most evident in the left para-aortic nodal station adjacent to the left renal hilum, as above. Filling defects in the urinary bladder discussed above, favored to represent clots, although additional foci of urothelial neoplasm is not entirely excluded.   He is here today for image guided biopsy of the para-aortic lymphadenopathy.  He c/o weakness, fatigue, decreased appetite and weight loss.   He denies fever/chills, N/V. He is NPO. No blood thinners.  Past Medical History:  Diagnosis Date  . Alcohol dependence in remission ([place])   . Chewing tobacco nicotine dependence, uncomplicated   . Chronic viral hepatitis C ([place])   . Cognitive communication deficit   . 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 Dysphagia, oropharyngeal phase   . Essential hypertension   . Extended spectrum beta lactamase (ESBL) resistance   . Gross hematuria   . History of traumatic brain injury   . Major depressive disorder   . Muscle weakness (generalized)   . Unspecified severe protein-calorie  malnutrition ([place])   . Urinary tract infection, site not specified     History reviewed. No pertinent surgical history.  Allergies: Patient has no known allergies.  Medications: Prior to Admission medications   Medication Sig Start Date End Date Taking? Authorizing Provider  escitalopram (LEXAPRO) 10 MG tablet Take 10 mg by mouth daily.   Yes [provider]  Melatonin 300 MCG TABS Take 1.5 tablets by mouth at bedtime.  11/30/16  Yes [provider]  MULTIPLE VITAMIN PO Take 1 tablet by mouth daily.   Yes [provider]  thiamine (VITAMIN B-1) 100 MG tablet Take 100 mg by mouth daily.   Yes [provider]  traMADol (ULTRAM) 50 MG tablet Take by mouth every 12 (twelve) hours as needed.   Yes [provider]  diphenhydrAMINE (BENADRYL) 50 MG/ML injection Inject into the muscle. 11/30/16   [provider]  divalproex (DEPAKOTE SPRINKLE) 125 MG capsule Take by mouth. 11/30/16   [provider]  enoxaparin (LOVENOX) 40 MG/0.4ML injection Inject into the skin. 12/01/16   [provider]  finasteride (PROSCAR) 5 MG tablet TK 1 T PO QD 07/17/14   [provider]  gabapentin (NEURONTIN) 400 MG capsule TK 1 C PO TID 08/01/14   [provider]  imipenem-cilastatin (PRIMAXIN) 500 MG injection Inject 500 mg into the vein every 6 (six) hours.    [provider]  LORazepam (ATIVAN) 2 MG/ML injection Inject into the vein. 11/30/16   [provider]  meloxicam (MOBIC) 7.5 MG tablet TK 1 T PO BID 07/24/14   [provider]  methocarbamol (ROBAXIN) 500 MG tablet TK 1  T PO TID PRN 10/05/14   [provider]  nicotine (NICODERM CQ - DOSED IN MG/24 HOURS) 14 mg/24hr patch Place onto the skin. 12/01/16   [provider]  ofloxacin (OCUFLOX) 0.3 % ophthalmic solution Place 4 drops into the left ear 2 (two) times daily. Patient not taking: Reported on 04/03/2018 08/31/15   Beers, [name], PA-C    oxyCODONE-acetaminophen (ROXICET) 5-325 MG tablet Take 1-2 tablets by mouth every 4 (four) hours as needed for severe pain. Patient not taking: Reported on 04/03/2018 08/31/15   Arlyss Repress, PA-C  risperidone (RISPERDAL M-TABS) 3 MG disintegrating tablet Take by mouth. 11/30/16   [provider]  tamsulosin (FLOMAX) 0.4 MG CAPS capsule TK 1 C PO QD 07/17/14   [provider]  tiotropium (SPIRIVA HANDIHALER) 18 MCG inhalation capsule INHALE CONTENTS OF 1 CAPSULE VIA HANDIHALER QD 10/05/14   [provider]     Family History  Problem Relation Age of Onset  . Kidney disease Mother   . Hypertension Brother   . Prostate cancer Brother     Social History   Socioeconomic History  . Marital status: Single    Spouse name: Not on file  . Number of children: Not on file  . Years of education: Not on file  . Highest education level: Not on file  Occupational History  . Not on file  Social Needs  . Financial resource strain: Not on file  . Food insecurity:    Worry: Not on file    Inability: Not on file  . Transportation needs:    Medical: Not on file    Non-medical: Not on file  Tobacco Use  . Smoking status: Former Smoker    Packs/day: 1.00    Years: 10.00    Pack years: 10.00    Types: Cigarettes    Last attempt to quit: 04/03/2017    Years since quitting: 1.0  . Smokeless tobacco: Never Used  Substance and Sexual Activity  . Alcohol use: Not Currently    Comment: quit in 2019  . Drug use: Not Currently    Types: "Crack" cocaine    Comment: quit in 2019  . Sexual activity: Yes  Lifestyle  . Physical activity:    Days per week: Not on file    Minutes per session: Not on file  . Stress: Not on file  Relationships  . Social connections:    Talks on phone: Not on file    Gets together: Not on file    Attends religious service: Not on file    Active member of club or organization: Not on file    Attends meetings of clubs or organizations: Not on  file    Relationship status: Not on file  Other Topics Concern  . Not on file  Social History Narrative  . Not on file     Review of Systems: A 12 point ROS discussed and pertinent positives are indicated in the HPI above.  All other systems are negative.  Review of Systems  Vital Signs: BP (!) 155/105   Pulse 61   Temp 97.9 F (36.6 C) (Oral)   Resp 20   Ht 6' (1.829 m)   SpO2 97%   BMI 23.06 kg/m   Physical Exam Vitals signs reviewed.  Constitutional:      Appearance: Normal appearance.  HENT:     Head: Normocephalic and atraumatic.  Eyes:     Extraocular Movements: Extraocular movements intact.  Neck:  Musculoskeletal: Normal range of motion.  Cardiovascular:     Rate and Rhythm: Normal rate and regular rhythm.  Pulmonary:     Effort: Pulmonary effort is normal.     Breath sounds: Normal breath sounds.  Abdominal:     General: There is no distension.     Palpations: Abdomen is soft.     Tenderness: There is no abdominal tenderness.  Musculoskeletal: Normal range of motion.  Skin:    General: Skin is warm and dry.  Neurological:     General: No focal deficit present.     Mental Status: He is alert and oriented to person, place, and time.  Psychiatric:        Mood and Affect: Mood normal.        Behavior: Behavior normal.        Thought Content: Thought content normal.        Judgment: Judgment normal.     Imaging: No results found.  Labs:  CBC: Recent Labs    04/03/18 1206 04/16/18 0935  WBC 2.7* 2.7*  HGB 11.8* 11.2*  HCT 37.2* 34.2*  PLT 176 211    COAGS: Recent Labs    04/03/18 1206  INR 1.08  APTT 35    BMP: Recent Labs    03/09/18 1353 04/03/18 1206  NA  --  141  K  --  4.1  CL  --  106  CO2  --  28  GLUCOSE  --  133*  BUN  --  12  CALCIUM  --  10.9*  CREATININE 1.30* 1.17  GFRNONAA  --  >60  GFRAA  --  >60    LIVER FUNCTION TESTS: Recent Labs    04/03/18 1206  BILITOT 0.5  AST 20  ALT 12  ALKPHOS 79    PROT 7.9  ALBUMIN 4.0    TUMOR MARKERS: No results for input(s): AFPTM, CEA, CA199, CHROMGRNA in the last 8760 hours.  Assessment and Plan:  Renal mass and para-aortic lymphadenopathy.  Will proceed with image guided biopsy today by Dr. Kathlene Cote.  Risks and benefits discussed with the patient including, but not limited to bleeding, infection, damage to adjacent structures or low yield requiring additional tests.  All of the patient's questions were answered, patient is agreeable to proceed. Consent signed and in chart.  Thank you for this interesting consult.  I greatly enjoyed meeting Keith Daniels and look forward to participating in their care.  A copy of this report was sent to the requesting provider on this date.  Electronically Signed: Murrell Redden, PA-C   04/16/2018, 10:30 AM      I spent a total of  30 Minutes   in face to face in clinical consultation, greater than 50% of which was counseling/coordinating care for CT guided lymph node biopsy.

## 2018-04-16 NOTE — Procedures (Signed)
Interventional Radiology Procedure Note  Procedure: CT Guided Biopsy of Left Retroperitoneal Lymph Node Mass  Complications: None  Estimated Blood Loss: < 10 mL  Findings: 41 G core biopsy of left RP LN mass performed under CT guidance.  Five core samples obtained and sent to Pathology.  Venetia Night. Kathlene Cote, M.D Pager:  [phone]

## 2018-04-17 ENCOUNTER — Telehealth: Payer: Self-pay | Admitting: *Deleted

## 2018-04-17 ENCOUNTER — Telehealth: Payer: Self-pay

## 2018-04-17 NOTE — Telephone Encounter (Signed)
See MD for Follow up / Results per Almyra Free 04/17/18 scheduling message [facility] and spoke with [place] on 04/17/18 and made her aware of the  scheduled 04/27/18 MD appt to see Dr. Tasia Catchings @ 2:00.

## 2018-04-17 NOTE — Telephone Encounter (Signed)
Received call from [place], Nurse Practitioner at [facility]. NP wanted to know what was going on with patient and why was he following back with Dr. Tasia Catchings on 1/31. I notified her that patient had biopsy done on 1/20 and on 1/31 he was scheduled to see Dr. Tasia Catchings for review of biopsy results. Facility will notify family so they can come with patient and if that doesn't work they will call to schedule convenient time.

## 2018-04-23 LAB — SURGICAL PATHOLOGY

## 2018-04-24 ENCOUNTER — Telehealth: Payer: Self-pay | Admitting: *Deleted

## 2018-04-24 ENCOUNTER — Other Ambulatory Visit: Payer: Self-pay | Admitting: Anatomic Pathology & Clinical Pathology

## 2018-04-26 ENCOUNTER — Other Ambulatory Visit: Payer: Medicare Other

## 2018-04-26 NOTE — Progress Notes (Addendum)
Tumor Board Documentation  Keith Daniels was presented by Alease Medina, NP at our Tumor Board on 04/26/2018, which included representatives from medical oncology, radiation oncology, surgical, radiology, pathology, navigation, internal medicine, palliative care, research, genetics, pharmacy, pulmonology.  Keith Daniels currently presents as a new patient, for discussion, for new positive pathology, for [place] with history of the following treatments: surgical intervention(s).  Additionally, we reviewed previous medical and familial history, history of present illness, and recent lab results along with all available histopathologic and imaging studies. The tumor board considered available treatment options and made the following recommendations: Systemic treatment Radiation therapy (primary modality) SBRT if symptomatic.  Surgery not benficial with all the lymph nodes involved  The following procedures/referrals were also placed: No orders of the defined types were placed in this encounter.   Clinical Trial Status: not discussed   Staging used: AJCC Stage Group Stage to be determined after CT T3 N1  National site-specific guidelines NCCN were discussed with respect to the case.  Tumor board is a meeting of clinicians from various specialty areas who evaluate and discuss patients for whom a multidisciplinary approach is being considered. Final determinations in the plan of care are those of the provider(s). The responsibility for follow up of recommendations given during tumor board is that of the provider.   Today's extended care, comprehensive team conference, [place] was not present for the discussion and was not examined.   Multidisciplinary Tumor Board is a multidisciplinary case peer review process.  Decisions discussed in the Multidisciplinary Tumor Board reflect the opinions of the specialists present at the conference without having examined the patient.  Ultimately, treatment and diagnostic  decisions rest with the primary provider(s) and the patient.

## 2018-04-27 ENCOUNTER — Encounter: Payer: Self-pay | Admitting: Oncology

## 2018-04-27 ENCOUNTER — Other Ambulatory Visit: Payer: Self-pay

## 2018-04-27 ENCOUNTER — Inpatient Hospital Stay (HOSPITAL_BASED_OUTPATIENT_CLINIC_OR_DEPARTMENT_OTHER): Payer: Medicare Other | Admitting: Oncology

## 2018-04-27 VITALS — BP 164/99 | HR 66 | Temp 96.5°F | Resp 18 | Wt 139.6 lb

## 2018-04-27 DIAGNOSIS — Z79899 Other long term (current) drug therapy: Secondary | ICD-10-CM

## 2018-04-27 DIAGNOSIS — D649 Anemia, unspecified: Secondary | ICD-10-CM | POA: Diagnosis not present

## 2018-04-27 DIAGNOSIS — R590 Localized enlarged lymph nodes: Secondary | ICD-10-CM

## 2018-04-27 DIAGNOSIS — C642 Malignant neoplasm of left kidney, except renal pelvis: Secondary | ICD-10-CM

## 2018-04-27 DIAGNOSIS — F1021 Alcohol dependence, in remission: Secondary | ICD-10-CM

## 2018-04-27 DIAGNOSIS — D72819 Decreased white blood cell count, unspecified: Secondary | ICD-10-CM

## 2018-04-27 DIAGNOSIS — Z87891 Personal history of nicotine dependence: Secondary | ICD-10-CM

## 2018-04-27 NOTE — Progress Notes (Signed)
Patient here for biopsy results. 

## 2018-04-28 ENCOUNTER — Encounter: Payer: Self-pay | Admitting: Oncology

## 2018-04-28 DIAGNOSIS — Z7189 Other specified counseling: Secondary | ICD-10-CM | POA: Insufficient documentation

## 2018-04-28 NOTE — Progress Notes (Signed)
Hematology/Oncology Consult note Keith Daniels [phone] [phone]   Patient Care Team: [name], MD as PCP - General (Family Medicine)  REFERRING PROVIDER: Dr.Stoiff REASON FOR VISIT:  Management of RCC  HISTORY OF PRESENTING ILLNESS:  Keith Daniels is a  68 y.o.  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  Patient has history of traumatic brain injury long term resident at [facility]. History of HCV, HTN, Alcohol abuse, IVDU.  Former smoker.  Patient was evaluated by Urology Dr.Stoiff due to hematuria for the past few weeks, associated with dysuria, increased frequency and urgency. Patient was treated with Omnicef 300mg  BID x 10 days empirically. Urine culture later ESBL, microbiology results not available to me. Per [facility] records, patient has been on Imipenem 500mg  Q6 hours. Per patient, he is finished the course today.   02/20/2018 US renal showed Abnormal appearance of the lower pole of the right kidney suspicious for a mass either neoplastic or infectious. There is moderate hydronephrosis on the left. Renal protocol MRI would be a useful next imaging step. Normal appearing right kidney. The urinary bladder has a small postvoid residual volume.  03/09/2018 CT hematuria work up showed 1. Large mass in the left renal pelvis involving the lower and interpolar regions of the left kidney, with some invasion of the renal parenchyma in the lower pole of the left kidney, highly concerning for primary urothelial neoplasm. This is associated with extensive retroperitoneal lymphadenopathy, most evident in the left para-aortic nodal station adjacent to the left renal hilum, as above. Filling defects in the urinary bladder discussed above, favored to represent clots, although additional foci of urothelial neoplasm is not entirely excluded. 2. Aortic  atherosclerosis. 3. Additional incidental findings, as above.  Patient was referred to cancer Daniels for further evaluation.  Patient reports feeling weak and fatigue for past few months. His has poor appetite and has had weight loss. Also reports generalized pain, not able to verbalize details.  Accompanied by brother Keith Daniels who is his POA  INTERVAL HISTORY Keith Daniels is a 68 y.o. male who has above history reviewed by me today presents for follow up visit for management of newly diagnosed RCC. Patient was accompanied by brother Keith Daniels who is his POA and his wife, and nephew Keith Daniels,  Patient has no new complaints.  Continues to have pinkish urine.  Denies any pain. Feels tired and fatigued.    Review of Systems  Constitutional: Positive for fatigue. Negative for appetite change, chills, fever and unexpected weight change.  HENT:   Negative for hearing loss and voice change.   Eyes: Negative for eye problems and icterus.  Respiratory: Negative for chest tightness, cough and shortness of breath.   Cardiovascular: Negative for chest pain and leg swelling.  Gastrointestinal: Negative for abdominal distention and abdominal pain.  Endocrine: Negative for hot flashes.  Genitourinary: Negative for difficulty urinating, dysuria and frequency.        Left flank discomfort  Musculoskeletal: Negative for arthralgias.  Skin: Negative for itching and rash.  Neurological: Negative for light-headedness and numbness.  Hematological: Negative for adenopathy. Does not bruise/bleed easily.  Psychiatric/Behavioral: Negative for confusion.    MEDICAL HISTORY:  Past Medical History:  Diagnosis Date  . Alcohol dependence in remission ([place])   . Chewing tobacco nicotine dependence, uncomplicated   . Chronic viral hepatitis C ([place])   . Cognitive communication deficit   . Diffuse  traumatic brain injury ([place])   . Dysphagia, oropharyngeal phase   . Essential hypertension   . Extended spectrum beta lactamase  (ESBL) resistance   . Gross hematuria   . History of traumatic brain injury   . Major depressive disorder   . Muscle weakness (generalized)   . Unspecified severe protein-calorie malnutrition ([place])   . Urinary tract infection, site not specified     SURGICAL HISTORY: History reviewed. No pertinent surgical history.  SOCIAL HISTORY: Social History   Socioeconomic History  . Marital status: Single    Spouse name: Not on file  . Number of children: Not on file  . Years of education: Not on file  . Highest education level: Not on file  Occupational History  . Not on file  Social Needs  . Financial resource strain: Not on file  . Food insecurity:    Worry: Not on file    Inability: Not on file  . Transportation needs:    Medical: Not on file    Non-medical: Not on file  Tobacco Use  . Smoking status: Former Smoker    Packs/day: 1.00    Years: 10.00    Pack years: 10.00    Types: Cigarettes    Last attempt to quit: 04/03/2017    Years since quitting: 1.0  . Smokeless tobacco: Never Used  Substance and Sexual Activity  . Alcohol use: Not Currently    Comment: quit in 2019  . Drug use: Not Currently    Types: "Crack" cocaine    Comment: quit in 2019  . Sexual activity: Yes  Lifestyle  . Physical activity:    Days per week: Not on file    Minutes per session: Not on file  . Stress: Not on file  Relationships  . Social connections:    Talks on phone: Not on file    Gets together: Not on file    Attends religious service: Not on file    Active member of club or organization: Not on file    Attends meetings of clubs or organizations: Not on file    Relationship status: Not on file  . Intimate partner violence:    Fear of current or ex partner: Not on file    Emotionally abused: Not on file    Physically abused: Not on file    Forced sexual activity: Not on file  Other Topics Concern  . Not on file  Social History Narrative  . Not on file    FAMILY  HISTORY: Family History  Problem Relation Age of Onset  . Kidney disease Mother   . Hypertension Brother   . Prostate cancer Brother     ALLERGIES:  has No Known Allergies.  MEDICATIONS:  Current Outpatient Medications  Medication Sig Dispense Refill  . escitalopram (LEXAPRO) 10 MG tablet Take 10 mg by mouth daily.    . Melatonin 300 MCG TABS Take 1.5 tablets by mouth at bedtime.     . MULTIPLE VITAMIN PO Take 1 tablet by mouth daily.    [name] thiamine (VITAMIN B-1) 100 MG tablet Take 100 mg by mouth daily.    . traMADol (ULTRAM) 50 MG tablet Take by mouth every 12 (twelve) hours as needed.    . diphenhydrAMINE (BENADRYL) 50 MG/ML injection Inject into the muscle.    . divalproex (DEPAKOTE SPRINKLE) 125 MG capsule Take by mouth.    . enoxaparin (LOVENOX) 40 MG/0.4ML injection Inject into the skin.    . finasteride (PROSCAR) 5  MG tablet TK 1 T PO QD    . gabapentin (NEURONTIN) 400 MG capsule TK 1 C PO TID    . imipenem-cilastatin (PRIMAXIN) 500 MG injection Inject 500 mg into the vein every 6 (six) hours.    [name] LORazepam (ATIVAN) 2 MG/ML injection Inject into the vein.    . meloxicam (MOBIC) 7.5 MG tablet TK 1 T PO BID    . methocarbamol (ROBAXIN) 500 MG tablet TK 1 T PO TID PRN    . nicotine (NICODERM CQ - DOSED IN MG/24 HOURS) 14 mg/24hr patch Place onto the skin.    [name] ofloxacin (OCUFLOX) 0.3 % ophthalmic solution Place 4 drops into the left ear 2 (two) times daily. (Patient not taking: Reported on 04/03/2018) 5 mL 0  . oxyCODONE-acetaminophen (ROXICET) 5-325 MG tablet Take 1-2 tablets by mouth every 4 (four) hours as needed for severe pain. (Patient not taking: Reported on 04/03/2018) 15 tablet 0  . risperidone (RISPERDAL M-TABS) 3 MG disintegrating tablet Take by mouth.    . tamsulosin (FLOMAX) 0.4 MG CAPS capsule TK 1 C PO QD    . tiotropium (SPIRIVA HANDIHALER) 18 MCG inhalation capsule INHALE CONTENTS OF 1 CAPSULE VIA HANDIHALER QD     No current facility-administered medications for  this visit.      PHYSICAL EXAMINATION: ECOG PERFORMANCE STATUS: 3 - Symptomatic, >50% confined to bed Vitals:   04/27/18 1403  BP: (!) 164/99  Pulse: 66  Resp: 18  Temp: (!) 96.5 F (35.8 C)   Filed Weights   04/27/18 1403  Weight: 139 lb 9.6 oz (63.3 kg)    Physical Exam Constitutional:      General: He is not in acute distress.    Appearance: He is ill-appearing.  HENT:     Head: Normocephalic and atraumatic.  Eyes:     General: No scleral icterus.    Pupils: Pupils are equal, round, and reactive to light.  Neck:     Musculoskeletal: Normal range of motion and neck supple.  Cardiovascular:     Rate and Rhythm: Normal rate and regular rhythm.     Heart sounds: Normal heart sounds.  Pulmonary:     Effort: Pulmonary effort is normal. No respiratory distress.     Breath sounds: No wheezing.  Abdominal:     General: Bowel sounds are normal. There is no distension.     Palpations: Abdomen is soft. There is no mass.     Tenderness: There is no abdominal tenderness.  Musculoskeletal: Normal range of motion.        General: No deformity.     Comments: Decreased bilateral lower extremity muscle strength. 2-3/5  Skin:    General: Skin is warm and dry.     Findings: No erythema or rash.  Neurological:     Mental Status: He is alert and oriented to person, place, and time.     Cranial Nerves: No cranial nerve deficit.     Coordination: Coordination normal.  Psychiatric:        Behavior: Behavior normal.        Thought Content: Thought content normal.      LABORATORY DATA:  I have reviewed the data as listed Lab Results  Component Value Date   WBC 2.7 (L) 04/16/2018   HGB 11.2 (L) 04/16/2018   HCT 34.2 (L) 04/16/2018   MCV 85.7 04/16/2018   PLT 211 04/16/2018   Recent Labs    03/09/18 1353 04/03/18 1206  NA  --  141  K  --  4.1  CL  --  106  CO2  --  28  GLUCOSE  --  133*  BUN  --  12  CREATININE 1.30* 1.17  CALCIUM  --  10.9*  GFRNONAA  --  >60   GFRAA  --  >60  PROT  --  7.9  ALBUMIN  --  4.0  AST  --  20  ALT  --  12  ALKPHOS  --  79  BILITOT  --  0.5   Iron/TIBC/Ferritin/ %Sat No results found for: IRON, TIBC, FERRITIN, IRONPCTSAT      ASSESSMENT & PLAN:  1. Renal cell carcinoma of left kidney (HCC)   2. Hypercalcemia   3. Normocytic anemia   4. Retroperitoneal lymphadenopathy   cT3N1Mx, .  CT scan was independently reviewed and discussed.[name] Biopsy pathology report was discussed.  Case was discussed on multidisciplinary tumor conference on 04/26/2018. Diagnosis of renal cell carcinoma discussed with patient and his family members.  Patient's brother is his POA and help him to make decisions. We will obtain CT chest to complete staging. If no distant metastasis, question is whether he is a candidate for radical nephrectomy or not. Per urology Dr. Bernardo Heater, not surgical candidate due to bulky lymph nodes. Patient and the family's desire is exploring definitive treatment option and he may want to go to tertiary [facility] for second opinion. If confirmed not surgical candidate, will proceed with immunotherapy. If distant metastatic disease, poor risk group [ hypercalcemia, performance status, diagnosis <1year], will proceed with immunotherapy.   #Hypercalcemia, status post 1 dose of Zometa 4 mg IV. Repeat BMP. Further management pending CT results.  Orders Placed This Encounter  Procedures  . CT Chest W Contrast    Standing Status:   Future    Standing Expiration Date:   04/27/2019    Order Specific Question:   ** REASON FOR EXAM (FREE TEXT)    Answer:   Staging of Kidney cancer    Order Specific Question:   If indicated for the ordered procedure, I authorize the administration of contrast media per Radiology protocol    Answer:   Yes    Order Specific Question:   Preferred imaging location?    Answer:   [facility]    Order Specific Question:   Radiology Contrast Protocol - do NOT remove file path    Answer:    \\[email or web address]    All questions were answered. The patient knows to call the clinic with any problems questions or concerns.  Return of visit: to be determined.  We spent sufficient time to discuss many aspect of care, questions were answered to patient's satisfaction. Total face to face encounter time for this patient visit was 40 min. >50% of the time was  spent in counseling and coordination of care.    Keith Server, MD, PhD Hematology Oncology [facility] at [facility] Pager- [phone] 04/28/2018

## 2018-05-03 ENCOUNTER — Ambulatory Visit
Admission: RE | Admit: 2018-05-03 | Discharge: 2018-05-03 | Disposition: A | Discharge status: Home / Self Care | Payer: Medicare Other | Source: Ambulatory Visit | Attending: Oncology | Admitting: Oncology

## 2018-05-03 ENCOUNTER — Inpatient Hospital Stay: Payer: Medicare Other | Attending: Oncology

## 2018-05-03 DIAGNOSIS — C642 Malignant neoplasm of left kidney, except renal pelvis: Secondary | ICD-10-CM

## 2018-05-03 DIAGNOSIS — Z79899 Other long term (current) drug therapy: Secondary | ICD-10-CM | POA: Insufficient documentation

## 2018-05-03 DIAGNOSIS — C7801 Secondary malignant neoplasm of right lung: Secondary | ICD-10-CM | POA: Insufficient documentation

## 2018-05-03 DIAGNOSIS — N179 Acute kidney failure, unspecified: Secondary | ICD-10-CM | POA: Insufficient documentation

## 2018-05-03 DIAGNOSIS — Z993 Dependence on wheelchair: Secondary | ICD-10-CM | POA: Insufficient documentation

## 2018-05-03 DIAGNOSIS — Z87820 Personal history of traumatic brain injury: Secondary | ICD-10-CM | POA: Insufficient documentation

## 2018-05-03 DIAGNOSIS — I110 Hypertensive heart disease with heart failure: Secondary | ICD-10-CM | POA: Insufficient documentation

## 2018-05-03 DIAGNOSIS — F1021 Alcohol dependence, in remission: Secondary | ICD-10-CM | POA: Insufficient documentation

## 2018-05-03 DIAGNOSIS — E860 Dehydration: Secondary | ICD-10-CM | POA: Insufficient documentation

## 2018-05-03 DIAGNOSIS — R590 Localized enlarged lymph nodes: Secondary | ICD-10-CM | POA: Insufficient documentation

## 2018-05-03 DIAGNOSIS — D649 Anemia, unspecified: Secondary | ICD-10-CM | POA: Insufficient documentation

## 2018-05-03 DIAGNOSIS — Z66 Do not resuscitate: Secondary | ICD-10-CM | POA: Insufficient documentation

## 2018-05-03 DIAGNOSIS — Z87891 Personal history of nicotine dependence: Secondary | ICD-10-CM | POA: Insufficient documentation

## 2018-05-03 MED ORDER — IOHEXOL 300 MG/ML  SOLN
75.0000 mL | Freq: Once | INTRAMUSCULAR | Status: AC | PRN
  Administered 2018-05-03: 75 mL via INTRAVENOUS

## 2018-05-07 ENCOUNTER — Inpatient Hospital Stay: Payer: Medicare Other

## 2018-05-09 ENCOUNTER — Telehealth: Payer: Self-pay | Admitting: Pharmacy Technician

## 2018-05-09 ENCOUNTER — Telehealth: Payer: Self-pay | Admitting: Pharmacist

## 2018-05-09 ENCOUNTER — Telehealth: Payer: Self-pay | Admitting: Oncology

## 2018-05-09 DIAGNOSIS — C642 Malignant neoplasm of left kidney, except renal pelvis: Secondary | ICD-10-CM

## 2018-05-09 NOTE — Telephone Encounter (Signed)
Oral Oncology Patient Advocate Encounter  Received notification from [employer] that prior authorization for Inlyta is required.  PA submitted on CoverMyMeds Key [id number]  Status is pending  Oral Oncology Clinic will continue to follow.  [place] [facility] [place] Phone [phone] Fax [phone] 05/09/2018 1:12 PM

## 2018-05-09 NOTE — Telephone Encounter (Signed)
I spoke with the patient's brother / Cavon Nicolls,  as instructed by Almyra Free. I was on the phone with him for about 10 to 15 mins giving him the appt info numerous times. I also advised him to pick up a copy of the schedule when he comes to the Chemo Education Class. The patient's brother was not comprehending what the appointments were for and info regarding the location of where the appts are. Almyra Free was also advised.  I also called Geophysicist/field seismologist and s/w Tanya, whom transferred me to Transportation VM and I left a detailed msg including appt dates, times, address and contact number.

## 2018-05-09 NOTE — Telephone Encounter (Signed)
I spoke with the patient's brother / Zamar Odwyer, as instructed by Almyra Free. I was on the phone with him for about 10 to 15 mins giving him the appt info numerous times. I also advised him to pick up a copy of the schedule when he comes to the Chemo Education Class. The patient's brother was not comprehending what the appointments were for and info regarding the location of where the appts are. Almyra Free was also advised.  I also called Geophysicist/field seismologist and s/w Tanya, whom transferred me to Transportation VM and I left a detailed msg including appt dates, times, address and contact number.

## 2018-05-09 NOTE — Telephone Encounter (Signed)
Brother called wanting information on upcoming appts. Information was forwarded to family he patient needed to be her on 05/11/18 @ 9:00 am.

## 2018-05-09 NOTE — Telephone Encounter (Signed)
Oral Oncology Pharmacist Encounter  Received new prescription for axitinib (Inlyta) for the treatment of renal cell carcinoma in conjunction with pembrolizumab 200mg  IV q3 weeks, planned duration until disease progression or unacceptable toxicity. Per tumor board note, patient is a poor candidate for surgical resection due to the vast nodal involvement. Unclear at this time if patient has clear-cell RCC or nccRCC. Pathology favors nccRCC per note in Epic with final diagnosis pending surgical evaluation, but patient is not a candidate. Based on KEYNOTE-426 trial, pembrolizumab + axitnib is approved for advanced renal cell carcinoma but trial only included patients with clear cell RCC. There is an ongoing trial, [place] evaluating the efficacy of pembrolizumab monotherapy in nccRCC. Per NCCN guidelines, axitnib is listed as an option for patients with nccRCC. Based on this information, agree with use of pembrolizumab + axitinib use in this patient.  Labs from 04/03/2018 in Epic assessed, ok for treatment. Patient presented with hypercalcemia and has received Zometa.  Prescription dose and frequency assessed. Agree with starting dose of 5mg  PO daily and can titrate dose as tolerated. Patient was hypertensive at first appointment with oncology provider. Other BPs documented in Epic within 130-140 range. No antihypertensive medications identified on medication list within Epic. Recommend continued close monitoring of BP with initiation of axitnib as hypertension is a common AE.  Current medication list in Epic reviewed, no DDIs with axitinib identified. Food-drug interaction  Prescription has been e-scribed to the [employer] for benefits analysis and approval. Of note, patient lives at [facility] will continue to follow for best plan for delivery of medication to patient. Facility will likely also require an order to be sent for  the oral chemotherapy before the medication can be administered.  Oral Oncology Clinic will continue to follow for insurance authorization, copayment issues, initial counseling and start date.  Jalene Mullet, PharmD PGY2 Hematology/ Oncology Pharmacy Resident [facility]/HP/AP [facility] [phone] 05/09/2018 9:46 AM

## 2018-05-09 NOTE — Telephone Encounter (Signed)
Oral Oncology Patient Advocate Encounter  Prior Authorization for [employer] has been approved.    PA# [id number] Effective dates: 05/09/2018 through 03/28/2019  Patients co-pay is $0.00  Oral Oncology Clinic will continue to follow.   [place] [facility] [place] Phone [phone] Fax [phone] 05/09/2018 1:13 PM

## 2018-05-09 NOTE — Patient Instructions (Signed)
Pembrolizumab injection  What is this medicine?  PEMBROLIZUMAB (pem broe liz ue mab) is a monoclonal antibody. It is used to treat cervical cancer, esophageal cancer, head and neck cancer, hepatocellular cancer, Hodgkin lymphoma, kidney cancer, lymphoma, melanoma, Merkel cell carcinoma, lung cancer, stomach cancer, urothelial cancer, and cancers that have a certain genetic condition.  This medicine may be used for other purposes; ask your health care provider or pharmacist if you have questions.  COMMON BRAND NAME(S): Keytruda  What should I tell my health care provider before I take this medicine?  They need to know if you have any of these conditions:  -diabetes  -immune system problems  -inflammatory bowel disease  -liver disease  -lung or breathing disease  -lupus  -received or scheduled to receive an organ transplant or a stem-cell transplant that uses donor stem cells  -an unusual or allergic reaction to pembrolizumab, other medicines, foods, dyes, or preservatives  -pregnant or trying to get pregnant  -breast-feeding  How should I use this medicine?  This medicine is for infusion into a vein. It is given by a health care professional in a hospital or clinic setting.  A special MedGuide will be given to you before each treatment. Be sure to read this information carefully each time.  Talk to your pediatrician regarding the use of this medicine in children. While this drug may be prescribed for selected conditions, precautions do apply.  Overdosage: If you think you have taken too much of this medicine contact a poison control center or emergency room at once.  NOTE: This medicine is only for you. Do not share this medicine with others.  What if I miss a dose?  It is important not to miss your dose. Call your doctor or health care professional if you are unable to keep an appointment.  What may interact with this medicine?  Interactions have not been studied.  Give your health care provider a list of all the  medicines, herbs, non-prescription drugs, or dietary supplements you use. Also tell them if you smoke, drink alcohol, or use illegal drugs. Some items may interact with your medicine.  This list may not describe all possible interactions. Give your health care provider a list of all the medicines, herbs, non-prescription drugs, or dietary supplements you use. Also tell them if you smoke, drink alcohol, or use illegal drugs. Some items may interact with your medicine.  What should I watch for while using this medicine?  Your condition will be monitored carefully while you are receiving this medicine.  You may need blood work done while you are taking this medicine.  Do not become pregnant while taking this medicine or for 4 months after stopping it. Women should inform their doctor if they wish to become pregnant or think they might be pregnant. There is a potential for serious side effects to an unborn child. Talk to your health care professional or pharmacist for more information. Do not breast-feed an infant while taking this medicine or for 4 months after the last dose.  What side effects may I notice from receiving this medicine?  Side effects that you should report to your doctor or health care professional as soon as possible:  -allergic reactions like skin rash, itching or hives, swelling of the face, lips, or tongue  -bloody or black, tarry  -breathing problems  -changes in vision  -chest pain  -chills  -confusion  -constipation  -cough  -diarrhea  -dizziness or feeling faint or lightheaded  -  blistering, peeling or loosening of the skin, including inside the mouth -signs and symptoms of high blood sugar  such as dizziness; dry mouth; dry skin; fruity breath; nausea; stomach pain; increased hunger or thirst; increased urination -signs and symptoms of kidney injury like trouble passing urine or change in the amount of urine -signs and symptoms of liver injury like dark urine, light-colored stools, loss of appetite, nausea, right upper belly pain, yellowing of the eyes or skin -sweating -swollen lymph nodes -weight loss Side effects that usually do not require medical attention (report to your doctor or health care professional if they continue or are bothersome): -decreased appetite -muscle pain -tiredness This list may not describe all possible side effects. Call your doctor for medical advice about side effects. You may report side effects to FDA at 1-800-FDA-1088. Where should I keep my medicine? This drug is given in a hospital or clinic and will not be stored at home. NOTE: This sheet is a summary. It may not cover all possible information. If you have questions about this medicine, talk to your doctor, pharmacist, or health care provider.  2019 Elsevier/Gold Standard (2017-10-26 15:06:10) Pembrolizumab injection What is this medicine? PEMBROLIZUMAB (pem broe liz ue mab) is a monoclonal antibody. It is used to treat cervical cancer, esophageal cancer, head and neck cancer, hepatocellular cancer, Hodgkin lymphoma, kidney cancer, lymphoma, melanoma, Merkel cell carcinoma, lung cancer, stomach cancer, urothelial cancer, and cancers that have a certain genetic condition. This medicine may be used for other purposes; ask your health care provider or pharmacist if you have questions. COMMON BRAND NAME(S): Keytruda What should I tell my health care provider before I take this medicine? They need to know if you have any of these conditions: -diabetes -immune system problems -inflammatory bowel disease -liver disease -lung or breathing disease -lupus -received or scheduled to receive an organ  transplant or a stem-cell transplant that uses donor stem cells -an unusual or allergic reaction to pembrolizumab, other medicines, foods, dyes, or preservatives -pregnant or trying to get pregnant -breast-feeding How should I use this medicine? This medicine is for infusion into a vein. It is given by a health care professional in a hospital or clinic setting. A special MedGuide will be given to you before each treatment. Be sure to read this information carefully each time. Talk to your pediatrician regarding the use of this medicine in children. While this drug may be prescribed for selected conditions, precautions do apply. Overdosage: If you think you have taken too much of this medicine contact a poison control center or emergency room at once. NOTE: This medicine is only for you. Do not share this medicine with others. What if I miss a dose? It is important not to miss your dose. Call your doctor or health care professional if you are unable to keep an appointment. What may interact with this medicine? Interactions have not been studied. Give your health care provider a list of all the medicines, herbs, non-prescription drugs, or dietary supplements you use. Also tell them if you smoke, drink alcohol, or use illegal drugs. Some items may interact with your medicine. This list may not describe all possible interactions. Give your health care provider a list of all the medicines, herbs, non-prescription drugs, or dietary supplements you use. Also tell them if you smoke, drink alcohol, or use illegal drugs. Some items may interact with your medicine. What should I watch for while using this medicine? Your condition will be monitored carefully while you are  receiving this medicine. You may need blood work done while you are taking this medicine. Do not become pregnant while taking this medicine or for 4 months after stopping it. Women should inform their doctor if they wish to become pregnant  or think they might be pregnant. There is a potential for serious side effects to an unborn child. Talk to your health care professional or pharmacist for more information. Do not breast-feed an infant while taking this medicine or for 4 months after the last dose. What side effects may I notice from receiving this medicine? Side effects that you should report to your doctor or health care professional as soon as possible: -allergic reactions like skin rash, itching or hives, swelling of the face, lips, or tongue -bloody or black, tarry -breathing problems -changes in vision -chest pain -chills -confusion -constipation -cough -diarrhea -dizziness or feeling faint or lightheaded -fast or irregular heartbeat -fever -flushing -hair loss -joint pain -low blood counts - this medicine may decrease the number of white blood cells, red blood cells and platelets. You may be at increased risk for infections and bleeding. -muscle pain -muscle weakness -persistent headache -redness, blistering, peeling or loosening of the skin, including inside the mouth -signs and symptoms of high blood sugar such as dizziness; dry mouth; dry skin; fruity breath; nausea; stomach pain; increased hunger or thirst; increased urination -signs and symptoms of kidney injury like trouble passing urine or change in the amount of urine -signs and symptoms of liver injury like dark urine, light-colored stools, loss of appetite, nausea, right upper belly pain, yellowing of the eyes or skin -sweating -swollen lymph nodes -weight loss Side effects that usually do not require medical attention (report to your doctor or health care professional if they continue or are bothersome): -decreased appetite -muscle pain -tiredness This list may not describe all possible side effects. Call your doctor for medical advice about side effects. You may report side effects to FDA at 1-800-FDA-1088. Where should I keep my medicine? This  drug is given in a hospital or clinic and will not be stored at home. NOTE: This sheet is a summary. It may not cover all possible information. If you have questions about this medicine, talk to your doctor, pharmacist, or health care provider.  2019 Elsevier/Gold Standard (2017-10-26 15:06:10)

## 2018-05-11 ENCOUNTER — Encounter: Payer: Self-pay | Admitting: Hospice and Palliative Medicine

## 2018-05-11 ENCOUNTER — Telehealth: Payer: Self-pay | Admitting: Pharmacist

## 2018-05-11 ENCOUNTER — Other Ambulatory Visit: Payer: Self-pay | Admitting: *Deleted

## 2018-05-11 ENCOUNTER — Encounter: Payer: Self-pay | Admitting: Oncology

## 2018-05-11 ENCOUNTER — Inpatient Hospital Stay (HOSPITAL_BASED_OUTPATIENT_CLINIC_OR_DEPARTMENT_OTHER): Payer: Medicare Other | Admitting: Hospice and Palliative Medicine

## 2018-05-11 ENCOUNTER — Other Ambulatory Visit: Payer: Self-pay

## 2018-05-11 ENCOUNTER — Other Ambulatory Visit: Payer: Self-pay | Admitting: Oncology

## 2018-05-11 ENCOUNTER — Inpatient Hospital Stay: Payer: Medicare Other

## 2018-05-11 VITALS — BP 146/86 | HR 61 | Temp 96.6°F | Resp 18 | Wt 112.0 lb

## 2018-05-11 DIAGNOSIS — Z79899 Other long term (current) drug therapy: Secondary | ICD-10-CM | POA: Diagnosis not present

## 2018-05-11 DIAGNOSIS — Z87891 Personal history of nicotine dependence: Secondary | ICD-10-CM | POA: Diagnosis not present

## 2018-05-11 DIAGNOSIS — Z87820 Personal history of traumatic brain injury: Secondary | ICD-10-CM

## 2018-05-11 DIAGNOSIS — Z66 Do not resuscitate: Secondary | ICD-10-CM | POA: Diagnosis not present

## 2018-05-11 DIAGNOSIS — D649 Anemia, unspecified: Secondary | ICD-10-CM | POA: Diagnosis not present

## 2018-05-11 DIAGNOSIS — C649 Malignant neoplasm of unspecified kidney, except renal pelvis: Secondary | ICD-10-CM

## 2018-05-11 DIAGNOSIS — Z7189 Other specified counseling: Secondary | ICD-10-CM

## 2018-05-11 DIAGNOSIS — F1021 Alcohol dependence, in remission: Secondary | ICD-10-CM | POA: Diagnosis not present

## 2018-05-11 DIAGNOSIS — C642 Malignant neoplasm of left kidney, except renal pelvis: Secondary | ICD-10-CM

## 2018-05-11 DIAGNOSIS — E860 Dehydration: Secondary | ICD-10-CM | POA: Diagnosis not present

## 2018-05-11 DIAGNOSIS — I110 Hypertensive heart disease with heart failure: Secondary | ICD-10-CM | POA: Diagnosis not present

## 2018-05-11 DIAGNOSIS — R590 Localized enlarged lymph nodes: Secondary | ICD-10-CM | POA: Diagnosis not present

## 2018-05-11 DIAGNOSIS — N179 Acute kidney failure, unspecified: Secondary | ICD-10-CM | POA: Diagnosis not present

## 2018-05-11 DIAGNOSIS — C7801 Secondary malignant neoplasm of right lung: Secondary | ICD-10-CM | POA: Diagnosis not present

## 2018-05-11 DIAGNOSIS — Z993 Dependence on wheelchair: Secondary | ICD-10-CM | POA: Diagnosis not present

## 2018-05-11 HISTORY — DX: Malignant neoplasm of unspecified kidney, except renal pelvis: C64.9

## 2018-05-11 MED ORDER — AXITINIB 5 MG PO TABS: 5.0000 mg | ORAL_TABLET | Freq: Two times a day (BID) | ORAL | 0 refills | Status: AC

## 2018-05-11 MED ORDER — ONDANSETRON HCL 8 MG PO TABS: 8.0000 mg | ORAL_TABLET | Freq: Two times a day (BID) | ORAL | 2 refills | Status: AC

## 2018-05-11 MED ORDER — AXITINIB 5 MG PO TABS: 5.0000 mg | ORAL_TABLET | Freq: Two times a day (BID) | ORAL | 0 refills | Status: DC

## 2018-05-11 NOTE — Progress Notes (Signed)
[facility]  Telephone:([phone] [phone]  Patient Care Team: [name], MD as PCP - General (Family Medicine)   Name of the patient: Keith Daniels  [id number]  1951/02/03   Date of Visit: 05/11/18  Diagnosis: RCC  Current Treatment: Starting on axitinib with pembrolizumab  Reason for Visit: This patient is a 68 y.o. male who presents to chemo care clinic today for initial meeting in preparation for starting chemotherapy. I introduced the chemo care clinic and we discussed that the role of the clinic is to assist those who are at an increased risk of emergency room visits and/or complications during the course of chemotherapy treatment. We discussed that the increased risk takes into account factors such as age, performance status, and co-morbidities. We also discussed that for some, this might include barriers to care such as not having a primary care provider, lack of insurance/transportation, or not being able to afford medications. We discussed that the goal of the program is to help prevent unplanned ER visits and help reduce complications during chemotherapy. We do this by discussing specific risk factors to each individual and identifying ways that we can help improve these risk factors and reduce barriers to care.   Hematology/Oncology History:   No history exists.     No Known Allergies   Past Medical History:  Diagnosis Date  . Alcohol dependence in remission ([place])   . Chewing tobacco nicotine dependence, uncomplicated   . Chronic viral hepatitis C ([place])   . Cognitive communication deficit   . 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 Dysphagia, oropharyngeal phase   . Essential hypertension   . Extended spectrum beta lactamase (ESBL) resistance   . Gross hematuria   . History of traumatic brain injury   . Major depressive disorder   . Muscle weakness (generalized)   . Unspecified severe  protein-calorie malnutrition ([place])   . Urinary tract infection, site not specified      No past surgical history on file.  Social History   Socioeconomic History  . Marital status: Single    Spouse name: Not on file  . Number of children: Not on file  . Years of education: Not on file  . Highest education level: Not on file  Occupational History  . Not on file  Social Needs  . Financial resource strain: Not on file  . Food insecurity:    Worry: Not on file    Inability: Not on file  . Transportation needs:    Medical: Not on file    Non-medical: Not on file  Tobacco Use  . Smoking status: Former Smoker    Packs/day: 1.00    Years: 10.00    Pack years: 10.00    Types: Cigarettes    Last attempt to quit: 04/03/2017    Years since quitting: 1.1  . Smokeless tobacco: Never Used  Substance and Sexual Activity  . Alcohol use: Not Currently    Comment: quit in 2019  . Drug use: Not Currently    Types: "Crack" cocaine    Comment: quit in 2019  . Sexual activity: Yes  Lifestyle  . Physical activity:    Days per week: Not on file    Minutes per session: Not on file  . Stress: Not on file  Relationships  . Social connections:    Talks on phone: Not on file    Gets together: Not on file    Attends religious  service: Not on file    Active member of club or organization: Not on file    Attends meetings of clubs or organizations: Not on file    Relationship status: Not on file  . Intimate partner violence:    Fear of current or ex partner: Not on file    Emotionally abused: Not on file    Physically abused: Not on file    Forced sexual activity: Not on file  Other Topics Concern  . Not on file  Social History Narrative  . Not on file    Family History  Problem Relation Age of Onset  . Kidney disease Mother   . Hypertension Brother   . Prostate cancer Brother     Current Outpatient Medications  Medication Sig Dispense Refill  . diphenhydrAMINE (BENADRYL) 50  MG/ML injection Inject into the muscle.    . divalproex (DEPAKOTE SPRINKLE) 125 MG capsule Take by mouth.    . enoxaparin (LOVENOX) 40 MG/0.4ML injection Inject into the skin.    [name] escitalopram (LEXAPRO) 10 MG tablet Take 10 mg by mouth daily.    . finasteride (PROSCAR) 5 MG tablet TK 1 T PO QD    . gabapentin (NEURONTIN) 400 MG capsule TK 1 C PO TID    . imipenem-cilastatin (PRIMAXIN) 500 MG injection Inject 500 mg into the vein every 6 (six) hours.    [name] LORazepam (ATIVAN) 2 MG/ML injection Inject into the vein.    . Melatonin 300 MCG TABS Take 1.5 tablets by mouth at bedtime.     . meloxicam (MOBIC) 7.5 MG tablet TK 1 T PO BID    . methocarbamol (ROBAXIN) 500 MG tablet TK 1 T PO TID PRN    . MULTIPLE VITAMIN PO Take 1 tablet by mouth daily.    . nicotine (NICODERM CQ - DOSED IN MG/24 HOURS) 14 mg/24hr patch Place onto the skin.    [name] ofloxacin (OCUFLOX) 0.3 % ophthalmic solution Place 4 drops into the left ear 2 (two) times daily. (Patient not taking: Reported on 04/03/2018) 5 mL 0  . oxyCODONE-acetaminophen (ROXICET) 5-325 MG tablet Take 1-2 tablets by mouth every 4 (four) hours as needed for severe pain. (Patient not taking: Reported on 04/03/2018) 15 tablet 0  . risperidone (RISPERDAL M-TABS) 3 MG disintegrating tablet Take by mouth.    . tamsulosin (FLOMAX) 0.4 MG CAPS capsule TK 1 C PO QD    . thiamine (VITAMIN B-1) 100 MG tablet Take 100 mg by mouth daily.    [name] tiotropium (SPIRIVA HANDIHALER) 18 MCG inhalation capsule INHALE CONTENTS OF 1 CAPSULE VIA HANDIHALER QD    . traMADol (ULTRAM) 50 MG tablet Take by mouth every 12 (twelve) hours as needed.     No current facility-administered medications for this visit.      PERFORMANCE STATUS (ECOG) : 3 - Symptomatic, >50% confined to bed  Review of Systems As noted above. Otherwise, a complete review of systems is negative.  Physical Exam General: NAD, frail appearing, thin Cardiovascular: regular rate and rhythm Pulmonary: clear ant  fields Abdomen: soft, nontender, + bowel sounds GU: no suprapubic tenderness Extremities: no edema, no joint deformities Skin: no rashes Neurological: Weakness but otherwise nonfocal   Assessment and Plan:    1. Cancer: RCC [id number]. Not felt to be a surgical candidate. Starting on axitinib with pembrolizumab   2. High Risk for ER/Hospitalization during Chemotherapy: We discussed the role of the chemo care clinic and identified patient specific risk factors. I discussed that  patient was identified as high risk primarily based on: comorbidities. We also discussed the role of the Symptom Management and Palliative Care Clinics at [facility] and methods of contacting clinic/provider. He denies needing specific assistance at this time.  Current PCP: [name], MD  Hospital Admissions: 0  ED Visits: 0  Has Medicaid: Yes  Has Medicare: Yes  In relationship: No  Has Anemia: Yes  Has asthma: No  Has atrial fibrillation: No  Has CVD: No  Has chronic kidney disease: No  Has Chronic Obstructive Pulmonary Disease: No  Has Congestive Heart Failure: No  Has Connective Tissue Disorder: No  Has Depression: No  Has Diabetes: No  Has liver disease: Yes  Has Peripheral Vascular Disease: No    3. Social Determinants of Health:   Housing - Patient is a resident at [facility] - No concerns due to living at facility  Transportation - No concerns due to living at facility  Utilities - No concerns due to living at facility  Safety - No concerns  Financial Strain - No concerns  Employment - Patient is disabled  Chief of Staff - Has brother and sister-in-law who are involved but live in [place], [place]. Patient has another sister who is also in poor health. Patient is not married and does not have children.   Physical Activity - Patient is wheelchair bound at baseline. He is able to stand with assistance and feed himself. Otherwise, he requires assistance with ADLs.    4.  Co-morbidities Complicating Care:   Traumatic brain injury long term resident at [facility]. History of HCV, HTN, Alcohol abuse, IVDU  Patient is followed at [facility] by Dr. Nyra Capes. Will refer him to [facility]. Will also plan to follow him in the clinic.   We discussed ACP today. Patient's brother is his POA/HCPOA. Patient says he would not want to be resuscitated or have his life prolonged on machines. He does not want a feeding tube. He agreed with DNR.   I completed a MOST form today. The patient and family outlined their wishes for the following treatment decisions:  Cardiopulmonary Resuscitation: Do Not Attempt Resuscitation (DNR/No CPR)  Medical Interventions: Limited Additional Interventions: Use medical treatment, IV fluids and cardiac monitoring as indicated, DO NOT USE intubation or mechanical ventilation. May consider use of less invasive airway support such as BiPAP or CPAP. Also provide comfort measures. Transfer to the hospital if indicated. Avoid intensive care.   Antibiotics: Antibiotics if indicated  IV Fluids: IV fluids if indicated  Feeding Tube: No feeding tube    Case discussed with Dr. Tasia Catchings.  Patient expressed understanding and was in agreement with this plan. He also understands that He can call clinic at any time with any questions, concerns, or complaints.   A total of (30) minutes of face-to-face time was spent with this patient with greater than 50% of that time in counseling and care-coordination.   Signed by: Altha Harm, PhD, DNP, NP-C, [facility] [phone] (Work Cell)

## 2018-05-11 NOTE — Telephone Encounter (Signed)
Spoke with patient, brother and sister in law about the prescription process.   Medication will be sent by [employer] to [facility]; Attn: April Carey.  Scheduled for delivery on Tues 2/18.

## 2018-05-11 NOTE — Progress Notes (Signed)
START ON PATHWAY REGIMEN - Renal Cell     A cycle is every 21 days:     Axitinib      Pembrolizumab   **Always confirm dose/schedule in your pharmacy ordering system**  Patient Characteristics: Metastatic, Clear Cell, First Line, Intermediate or Poor Risk AJCC M Category: M1 AJCC 8 Stage Grouping: IV Current evidence of distant metastases<= Yes AJCC T Category: T1b AJCC N Category: N1 Does patient have oligometastatic disease<= No Histology: Clear Cell Line of Therapy: First Line Risk Status: Poor Risk Intent of Therapy: Non-Curative / Palliative Intent, Discussed with Patient

## 2018-05-11 NOTE — Addendum Note (Signed)
Addended by: Irean Hong on: 05/11/2018 12:35 PM   Modules accepted: Orders

## 2018-05-11 NOTE — Progress Notes (Signed)
Patient here for chemo care clinic.

## 2018-05-14 MED FILL — INLYTA 5 MG TABLET: 5 | 30 days supply | Qty: 60 | Fill #0

## 2018-05-14 NOTE — Telephone Encounter (Signed)
Oral Chemotherapy Pharmacist Encounter    Spoke with Mr. Diehl and his family following their chemo education class to review dosing, administration, and side effects for Inlyta (axitinib).  His Inlyta will delivered to [facility], his long-term care facility, to be administered to Mr. Mulkern. I spoke with his nurse Anderson Malta at the facility and they know to expect the medication delivery on 05/15/18. At the request of Anderson Malta, the package was sent to the attention of April Carey, the supervising RN for Mr. Mcinturff unit. They are aware that the [facility] will call them each month prior to sending his refill. I mailed the facility medication information for his [place] so that they are aware of the side effects of both medications.  Prescription for Bartholomew Boards was faxed to [facility] as an order for them to administer the [place] (fax: [phone]). Order included a start date of 05/16/18 to coordinate with the start of his infusion therapy. Also faxed over was a prescription for his ondansetron.   Darl Pikes, PharmD, BCPS, [facility] Hematology/Oncology Clinical Pharmacist [facility] [phone]  05/14/2018 9:20 AM

## 2018-05-15 ENCOUNTER — Other Ambulatory Visit: Payer: Self-pay

## 2018-05-15 DIAGNOSIS — C649 Malignant neoplasm of unspecified kidney, except renal pelvis: Secondary | ICD-10-CM

## 2018-05-16 ENCOUNTER — Inpatient Hospital Stay: Payer: Medicare Other

## 2018-05-16 ENCOUNTER — Inpatient Hospital Stay (HOSPITAL_BASED_OUTPATIENT_CLINIC_OR_DEPARTMENT_OTHER): Payer: Medicare Other | Admitting: Oncology

## 2018-05-16 ENCOUNTER — Encounter: Payer: Self-pay | Admitting: Oncology

## 2018-05-16 ENCOUNTER — Other Ambulatory Visit: Payer: Self-pay | Admitting: Oncology

## 2018-05-16 ENCOUNTER — Other Ambulatory Visit: Payer: Self-pay

## 2018-05-16 VITALS — BP 143/86 | HR 75 | Temp 97.1°F | Resp 18 | Wt 141.4 lb

## 2018-05-16 DIAGNOSIS — C642 Malignant neoplasm of left kidney, except renal pelvis: Secondary | ICD-10-CM | POA: Insufficient documentation

## 2018-05-16 DIAGNOSIS — Z993 Dependence on wheelchair: Secondary | ICD-10-CM

## 2018-05-16 DIAGNOSIS — Z5112 Encounter for antineoplastic immunotherapy: Secondary | ICD-10-CM

## 2018-05-16 DIAGNOSIS — Z5111 Encounter for antineoplastic chemotherapy: Secondary | ICD-10-CM

## 2018-05-16 DIAGNOSIS — R590 Localized enlarged lymph nodes: Secondary | ICD-10-CM

## 2018-05-16 DIAGNOSIS — C649 Malignant neoplasm of unspecified kidney, except renal pelvis: Secondary | ICD-10-CM

## 2018-05-16 DIAGNOSIS — Z66 Do not resuscitate: Secondary | ICD-10-CM

## 2018-05-16 DIAGNOSIS — Z7189 Other specified counseling: Secondary | ICD-10-CM

## 2018-05-16 DIAGNOSIS — Z79899 Other long term (current) drug therapy: Secondary | ICD-10-CM

## 2018-05-16 LAB — COMPREHENSIVE METABOLIC PANEL
ALT: 13 U/L (ref 0–44)
AST: 18 U/L (ref 15–41)
Albumin: 3.7 g/dL (ref 3.5–5.0)
Alkaline Phosphatase: 68 U/L (ref 38–126)
Anion gap: 8 (ref 5–15)
BUN: 12 mg/dL (ref 8–23)
CO2: 26 mmol/L (ref 22–32)
Calcium: 9.9 mg/dL (ref 8.9–10.3)
Chloride: 106 mmol/L (ref 98–111)
Creatinine, Ser: 1.18 mg/dL (ref 0.61–1.24)
GFR calc non Af Amer: >60 mL/min (ref >60)
Glucose, Bld: 136 mg/dL — ABNORMAL HIGH (ref 70–99)
Potassium: 3.8 mmol/L (ref 3.5–5.1)
SODIUM: 140 mmol/L (ref 135–145)
Total Bilirubin: 0.4 mg/dL (ref 0.3–1.2)
Total Protein: 7.6 g/dL (ref 6.5–8.1)

## 2018-05-16 LAB — CBC WITH DIFFERENTIAL/PLATELET
Abs Immature Granulocytes: 0 10*3/uL (ref 0.00–0.07)
Basophils Absolute: 0 10*3/uL (ref 0.0–0.1)
Basophils Relative: 0 %
Eosinophils Absolute: 0.1 10*3/uL (ref 0.0–0.5)
Eosinophils Relative: 2 %
HCT: 35.7 % — ABNORMAL LOW (ref 39.0–52.0)
Hemoglobin: 11.5 g/dL — ABNORMAL LOW (ref 13.0–17.0)
Immature Granulocytes: 0 %
Lymphocytes Relative: 30 %
Lymphs Abs: 1 10*3/uL (ref 0.7–4.0)
MCH: 27.6 pg (ref 26.0–34.0)
MCHC: 32.2 g/dL (ref 30.0–36.0)
MCV: 85.6 fL (ref 80.0–100.0)
Monocytes Absolute: 0.3 10*3/uL (ref 0.1–1.0)
Monocytes Relative: 8 %
NRBC: 0 % (ref 0.0–0.2)
Neutro Abs: 2 10*3/uL (ref 1.7–7.7)
Neutrophils Relative %: 60 %
Platelets: 204 10*3/uL (ref 150–400)
RBC: 4.17 MIL/uL — ABNORMAL LOW (ref 4.22–5.81)
RDW: 15.9 % — AB (ref 11.5–15.5)
WBC: 3.3 10*3/uL — ABNORMAL LOW (ref 4.0–10.5)

## 2018-05-16 LAB — TSH: TSH: 0.057 u[IU]/mL — ABNORMAL LOW (ref 0.350–4.500)

## 2018-05-16 MED ORDER — ONDANSETRON HCL 8 MG PO TABS: 8.0000 mg | ORAL_TABLET | Freq: Two times a day (BID) | ORAL | 1 refills | Status: DC | PRN

## 2018-05-16 MED ORDER — SODIUM CHLORIDE 0.9 % IV SOLN
200.0000 mg | Freq: Once | INTRAVENOUS | Status: AC
  Administered 2018-05-16: 200 mg via INTRAVENOUS
  Filled 2018-05-16: qty 8

## 2018-05-16 MED ORDER — ENSURE NUTRITION SHAKE PO LIQD: 8.0000 [foz_us] | Freq: Two times a day (BID) | ORAL | 11 refills | Status: DC

## 2018-05-16 MED ORDER — SODIUM CHLORIDE 0.9 % IV SOLN
Freq: Once | INTRAVENOUS | Status: AC
  Administered 2018-05-16: 11:00:00 via INTRAVENOUS
  Filled 2018-05-16: qty 250

## 2018-05-16 NOTE — Progress Notes (Signed)
Hematology/Oncology follow up  note [facility] Telephone:([phone] Fax:([phone]   Patient Care Team: [name], MD as PCP - General (Family Medicine)  REFERRING PROVIDER: Dr.Stoiff REASON FOR VISIT:  Management of RCC  HISTORY OF PRESENTING ILLNESS:  Normal Recinos is a  68 y.o.  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  Patient has history of traumatic brain injury long term resident at [facility]. History of HCV, HTN, Alcohol abuse, IVDU.  Former smoker.  Patient was evaluated by Urology Dr.Stoiff due to hematuria for the past few weeks, associated with dysuria, increased frequency and urgency. Patient was treated with Omnicef 300mg  BID x 10 days empirically. Urine culture later ESBL, microbiology results not available to me. Per NH records, patient has been on Imipenem 500mg  Q6 hours. Per patient, he is finished the course today.   02/20/2018 US renal showed Abnormal appearance of the lower pole of the right kidney suspicious for a mass either neoplastic or infectious. There is moderate hydronephrosis on the left. Renal protocol MRI would be a useful next imaging step. Normal appearing right kidney. The urinary bladder has a small postvoid residual volume.  03/09/2018 CT hematuria work up showed 1. Large mass in the left renal pelvis involving the lower and interpolar regions of the left kidney, with some invasion of the renal parenchyma in the lower pole of the left kidney, highly concerning for primary urothelial neoplasm. This is associated with extensive retroperitoneal lymphadenopathy, most evident in the left para-aortic nodal station adjacent to the left renal hilum, as above. Filling defects in the urinary bladder discussed above, favored to represent clots, although additional foci of urothelial neoplasm is not entirely excluded. 2. Aortic  atherosclerosis. 3. Additional incidental findings, as above.  Patient was referred to cancer center for further evaluation.  Patient reports feeling weak and fatigue for past few months. His has poor appetite and has had weight loss. Also reports generalized pain, not able to verbalize details.  Accompanied by brother Jenny Reichmann who is his POA  INTERVAL HISTORY Taylin Francois is a 68 y.o. male who has above history reviewed by me today presents for follow up visit for evaluation prior to starting anti-neoplasm therapy with Keytruda and Axitinib.   During the interval, he has had chest CT done for staging. CT showed multiple lung nodules, consistent with metastatic RCC.  I have called patient's POA John and discussed about CT results and management plan. Recommend systemic treatment with Axitinib and Keytruda. John agrees with plan.  Patient and his family members have been to chemotherapy class.  Today No family member presents. Patient was sent from NH.  Poor historian due to history of traumatic brain injury.  Reports left anterior chest wall "knot", with intermittent pain. No alleviating  or decerebrating factors.  Appetite is better.  .    Review of Systems  Constitutional: Positive for fatigue. Negative for appetite change, chills, fever and unexpected weight change.  HENT:   Negative for hearing loss and voice change.   Eyes: Negative for eye problems and icterus.  Respiratory: Negative for chest tightness, cough and shortness of breath.   Cardiovascular: Negative for chest pain and leg swelling.  Gastrointestinal: Negative for abdominal distention and abdominal pain.  Endocrine: Negative for hot flashes.  Genitourinary: Negative for difficulty urinating, dysuria and frequency.   Musculoskeletal: Negative for arthralgias.       Left anterior chest  wall "knot", intermittent pain.   Skin: Negative for itching and rash.  Neurological: Negative for light-headedness and numbness.    Hematological: Negative for adenopathy. Does not bruise/bleed easily.  Psychiatric/Behavioral: Negative for confusion.    MEDICAL HISTORY:  Past Medical History:  Diagnosis Date  . Alcohol dependence in remission ([place])   . Chewing tobacco nicotine dependence, uncomplicated   . Chronic viral hepatitis C ([place])   . Cognitive communication deficit   . Diffuse traumatic brain injury ([place])   . Dysphagia, oropharyngeal phase   . Essential hypertension   . Extended spectrum beta lactamase (ESBL) resistance   . Gross hematuria   . History of traumatic brain injury   . Major depressive disorder   . Metastatic renal cell carcinoma ([place]) 05/11/2018  . Muscle weakness (generalized)   . Unspecified severe protein-calorie malnutrition ([place])   . Urinary tract infection, site not specified     SURGICAL HISTORY: No past surgical history on file.  SOCIAL HISTORY: Social History   Socioeconomic History  . Marital status: Single    Spouse name: Not on file  . Number of children: Not on file  . Years of education: Not on file  . Highest education level: Not on file  Occupational History  . Not on file  Social Needs  . Financial resource strain: Not on file  . Food insecurity:    Worry: Not on file    Inability: Not on file  . Transportation needs:    Medical: Not on file    Non-medical: Not on file  Tobacco Use  . Smoking status: Former Smoker    Packs/day: 1.00    Years: 10.00    Pack years: 10.00    Types: Cigarettes    Last attempt to quit: 04/03/2017    Years since quitting: 1.1  . Smokeless tobacco: Never Used  Substance and Sexual Activity  . Alcohol use: Not Currently    Comment: quit in 2019  . Drug use: Not Currently    Types: "Crack" cocaine    Comment: quit in 2019  . Sexual activity: Yes  Lifestyle  . Physical activity:    Days per week: Not on file    Minutes per session: Not on file  . Stress: Not on file  Relationships  . Social connections:    Talks on  phone: Not on file    Gets together: Not on file    Attends religious service: Not on file    Active member of club or organization: Not on file    Attends meetings of clubs or organizations: Not on file    Relationship status: Not on file  . Intimate partner violence:    Fear of current or ex partner: Not on file    Emotionally abused: Not on file    Physically abused: Not on file    Forced sexual activity: Not on file  Other Topics Concern  . Not on file  Social History Narrative  . Not on file    FAMILY HISTORY: Family History  Problem Relation Age of Onset  . Kidney disease Mother   . Hypertension Brother   . Prostate cancer Brother     ALLERGIES:  has No Known Allergies.  MEDICATIONS:  Current Outpatient Medications  Medication Sig Dispense Refill  . axitinib (INLYTA) 5 MG tablet Take 1 tablet (5 mg total) by mouth 2 (two) times daily. 60 tablet 0  . diphenhydrAMINE (BENADRYL) 50 MG/ML injection Inject into the muscle.    [name]  divalproex (DEPAKOTE SPRINKLE) 125 MG capsule Take by mouth.    . enoxaparin (LOVENOX) 40 MG/0.4ML injection Inject into the skin.    [name] escitalopram (LEXAPRO) 10 MG tablet Take 10 mg by mouth daily.    . finasteride (PROSCAR) 5 MG tablet TK 1 T PO QD    . gabapentin (NEURONTIN) 400 MG capsule TK 1 C PO TID    . imipenem-cilastatin (PRIMAXIN) 500 MG injection Inject 500 mg into the vein every 6 (six) hours.    [name] LORazepam (ATIVAN) 2 MG/ML injection Inject into the vein.    . Melatonin 300 MCG TABS Take 1.5 tablets by mouth at bedtime.     . meloxicam (MOBIC) 7.5 MG tablet TK 1 T PO BID    . methocarbamol (ROBAXIN) 500 MG tablet TK 1 T PO TID PRN    . MULTIPLE VITAMIN PO Take 1 tablet by mouth daily.    . nicotine (NICODERM CQ - DOSED IN MG/24 HOURS) 14 mg/24hr patch Place onto the skin.    [name] ofloxacin (OCUFLOX) 0.3 % ophthalmic solution Place 4 drops into the left ear 2 (two) times daily. (Patient not taking: Reported on 04/03/2018) 5 mL 0  .  ondansetron (ZOFRAN) 8 MG tablet Take 1 tablet (8 mg total) by mouth 2 (two) times daily. 20 tablet 2  . oxyCODONE-acetaminophen (ROXICET) 5-325 MG tablet Take 1-2 tablets by mouth every 4 (four) hours as needed for severe pain. (Patient not taking: Reported on 04/03/2018) 15 tablet 0  . risperidone (RISPERDAL M-TABS) 3 MG disintegrating tablet Take by mouth.    . tamsulosin (FLOMAX) 0.4 MG CAPS capsule TK 1 C PO QD    . thiamine (VITAMIN B-1) 100 MG tablet Take 100 mg by mouth daily.    [name] tiotropium (SPIRIVA HANDIHALER) 18 MCG inhalation capsule INHALE CONTENTS OF 1 CAPSULE VIA HANDIHALER QD    . traMADol (ULTRAM) 50 MG tablet Take by mouth every 12 (twelve) hours as needed.     No current facility-administered medications for this visit.      PHYSICAL EXAMINATION: ECOG PERFORMANCE STATUS: 3 - Symptomatic, >50% confined to bed Vitals:   05/16/18 0928  BP: (!) 143/86  Pulse: 75  Resp: 18  Temp: (!) 97.1 F (36.2 C)   Filed Weights   05/16/18 0928  Weight: 141 lb 6.4 oz (64.1 kg)    Physical Exam Constitutional:      General: He is not in acute distress.    Appearance: He is ill-appearing.  HENT:     Head: Normocephalic and atraumatic.  Eyes:     General: No scleral icterus.    Pupils: Pupils are equal, round, and reactive to light.  Neck:     Musculoskeletal: Normal range of motion and neck supple.  Cardiovascular:     Rate and Rhythm: Normal rate and regular rhythm.     Heart sounds: Normal heart sounds.  Pulmonary:     Effort: Pulmonary effort is normal. No respiratory distress.     Breath sounds: No wheezing.  Abdominal:     General: Bowel sounds are normal. There is no distension.     Palpations: Abdomen is soft. There is no mass.     Tenderness: There is no abdominal tenderness.  Musculoskeletal: Normal range of motion.        General: No deformity.     Comments: Decreased bilateral lower extremity muscle strength. 2-3/5  Skin:    General: Skin is warm and dry.  Findings: No erythema or rash.  Neurological:     Mental Status: He is alert and oriented to person, place, and time.     Cranial Nerves: No cranial nerve deficit.     Coordination: Coordination normal.  Psychiatric:        Behavior: Behavior normal.        Thought Content: Thought content normal.      LABORATORY DATA:  I have reviewed the data as listed Lab Results  Component Value Date   WBC 2.7 (L) 04/16/2018   HGB 11.2 (L) 04/16/2018   HCT 34.2 (L) 04/16/2018   MCV 85.7 04/16/2018   PLT 211 04/16/2018   Recent Labs    03/09/18 1353 04/03/18 1206  NA  --  141  K  --  4.1  CL  --  106  CO2  --  28  GLUCOSE  --  133*  BUN  --  12  CREATININE 1.30* 1.17  CALCIUM  --  10.9*  GFRNONAA  --  >60  GFRAA  --  >60  PROT  --  7.9  ALBUMIN  --  4.0  AST  --  20  ALT  --  12  ALKPHOS  --  79  BILITOT  --  0.5   Iron/TIBC/Ferritin/ %Sat No results found for: IRON, TIBC, FERRITIN, IRONPCTSAT      ASSESSMENT & PLAN:  1. Metastatic renal cell carcinoma, unspecified laterality ([place])   2. Renal cell carcinoma of left kidney (HCC)   3. Goals of care, counseling/discussion   4. Retroperitoneal lymphadenopathy   5. Encounter for antineoplastic chemotherapy   6. Encounter for antineoplastic immunotherapy   [id number], Stage IV RCC. IMDC poor risk The diagnosis and care plan were discussed with patient and I also called his [facility] and discussed with him over the phone..  CT chest was independently reviewed and discussed.   NCCN guidelines were reviewed and shared with patient.   The goal of treatment which is to palliate disease, disease related symptoms, improve quality of life and hopefully prolong life was highlighted in our discussion.  Chemotherapy education was provided. I explained to the patient the risks and benefits of Axitinib including all but not limited to hypertension, heart failure,bleeding, protein in urine, diarrhea, low blood counts etc.   I discussed  the mechanism of action and rationale of using Keytruda.  The goal of therapy is palliative; and length of treatments are likely ongoing/based upon the results of the scans. Discussed the potential side effects of immunotherapy including but not limited to diarrhea; skin rash; respiratory failure, neurotoxicity, elevated LFTs/endocrine abnormalities etc. Patient not competent to be fully consented. Supportive care measures are necessary for patient well-being and will be provided as necessary. Patient's POA John voices understanding and give permission to proceed chemotherapy.   #Hypercalcemia, status post 1 dose of Zometa 4 mg IV. Calcium stable.  # Protein calorie malnutrition: ensure supplements BID.  Return of visit: 1 week for MD assessment for tolerability. 3 weeks for MD assessement/Lab and treatment We spent sufficient time to discuss many aspect of care, questions were answered to patient's satisfaction. Total face to face encounter time for this patient visit was 40 min. >50% of the time was  spent in counseling and coordination of care.    Earlie Server, MD, PhD Hematology Oncology [facility] Pager- [phone] 05/16/2018

## 2018-05-16 NOTE — Progress Notes (Signed)
Patient here for follow up. He states he is eating better since he got special utensils/silverware.

## 2018-05-23 ENCOUNTER — Encounter: Payer: Self-pay | Admitting: Oncology

## 2018-05-23 ENCOUNTER — Inpatient Hospital Stay (HOSPITAL_BASED_OUTPATIENT_CLINIC_OR_DEPARTMENT_OTHER): Payer: Medicare Other | Admitting: Hospice and Palliative Medicine

## 2018-05-23 ENCOUNTER — Inpatient Hospital Stay (HOSPITAL_BASED_OUTPATIENT_CLINIC_OR_DEPARTMENT_OTHER): Payer: Medicare Other | Admitting: Oncology

## 2018-05-23 ENCOUNTER — Inpatient Hospital Stay: Payer: Medicare Other

## 2018-05-23 VITALS — BP 131/82 | HR 72 | Temp 95.6°F | Ht 72.0 in | Wt 133.4 lb

## 2018-05-23 DIAGNOSIS — C649 Malignant neoplasm of unspecified kidney, except renal pelvis: Secondary | ICD-10-CM

## 2018-05-23 DIAGNOSIS — C7801 Secondary malignant neoplasm of right lung: Secondary | ICD-10-CM

## 2018-05-23 DIAGNOSIS — R590 Localized enlarged lymph nodes: Secondary | ICD-10-CM

## 2018-05-23 DIAGNOSIS — C642 Malignant neoplasm of left kidney, except renal pelvis: Secondary | ICD-10-CM

## 2018-05-23 DIAGNOSIS — Z515 Encounter for palliative care: Secondary | ICD-10-CM

## 2018-05-23 DIAGNOSIS — Z66 Do not resuscitate: Secondary | ICD-10-CM

## 2018-05-23 DIAGNOSIS — G893 Neoplasm related pain (acute) (chronic): Secondary | ICD-10-CM

## 2018-05-23 DIAGNOSIS — Z79899 Other long term (current) drug therapy: Secondary | ICD-10-CM

## 2018-05-23 DIAGNOSIS — N179 Acute kidney failure, unspecified: Secondary | ICD-10-CM

## 2018-05-23 DIAGNOSIS — R7989 Other specified abnormal findings of blood chemistry: Secondary | ICD-10-CM

## 2018-05-23 DIAGNOSIS — D649 Anemia, unspecified: Secondary | ICD-10-CM

## 2018-05-23 DIAGNOSIS — Z7189 Other specified counseling: Secondary | ICD-10-CM

## 2018-05-23 DIAGNOSIS — E860 Dehydration: Secondary | ICD-10-CM

## 2018-05-23 LAB — CBC WITH DIFFERENTIAL/PLATELET
Abs Immature Granulocytes: 0.01 10*3/uL (ref 0.00–0.07)
Basophils Absolute: 0 10*3/uL (ref 0.0–0.1)
Basophils Relative: 1 %
Eosinophils Absolute: 0.1 10*3/uL (ref 0.0–0.5)
Eosinophils Relative: 2 %
HCT: 36.6 % — ABNORMAL LOW (ref 39.0–52.0)
HEMOGLOBIN: 11.8 g/dL — AB (ref 13.0–17.0)
Immature Granulocytes: 0 %
LYMPHS PCT: 24 %
Lymphs Abs: 0.7 10*3/uL (ref 0.7–4.0)
MCH: 27.7 pg (ref 26.0–34.0)
MCHC: 32.2 g/dL (ref 30.0–36.0)
MCV: 85.9 fL (ref 80.0–100.0)
Monocytes Absolute: 0.3 10*3/uL (ref 0.1–1.0)
Monocytes Relative: 9 %
Neutro Abs: 1.9 10*3/uL (ref 1.7–7.7)
Neutrophils Relative %: 64 %
Platelets: 177 10*3/uL (ref 150–400)
RBC: 4.26 MIL/uL (ref 4.22–5.81)
RDW: 15.4 % (ref 11.5–15.5)
WBC: 2.9 10*3/uL — ABNORMAL LOW (ref 4.0–10.5)
nRBC: 0 % (ref 0.0–0.2)

## 2018-05-23 LAB — COMPREHENSIVE METABOLIC PANEL
ALT: 19 U/L (ref 0–44)
AST: 21 U/L (ref 15–41)
Albumin: 3.3 g/dL — ABNORMAL LOW (ref 3.5–5.0)
Alkaline Phosphatase: 74 U/L (ref 38–126)
Anion gap: 6 (ref 5–15)
BUN: 25 mg/dL — ABNORMAL HIGH (ref 8–23)
CO2: 29 mmol/L (ref 22–32)
Calcium: 9.9 mg/dL (ref 8.9–10.3)
Chloride: 104 mmol/L (ref 98–111)
Creatinine, Ser: 1.27 mg/dL — ABNORMAL HIGH (ref 0.61–1.24)
GFR calc Af Amer: >60 mL/min (ref >60)
GFR calc non Af Amer: 58 mL/min — ABNORMAL LOW (ref >60)
Glucose, Bld: 125 mg/dL — ABNORMAL HIGH (ref 70–99)
Potassium: 4.4 mmol/L (ref 3.5–5.1)
Sodium: 139 mmol/L (ref 135–145)
Total Bilirubin: 0.5 mg/dL (ref 0.3–1.2)
Total Protein: 8 g/dL (ref 6.5–8.1)

## 2018-05-23 LAB — TSH: TSH: 0.052 u[IU]/mL — ABNORMAL LOW (ref 0.350–4.500)

## 2018-05-23 MED ORDER — SODIUM CHLORIDE 0.9 % IV SOLN
Freq: Once | INTRAVENOUS | Status: AC
  Administered 2018-05-23: 15:00:00 via INTRAVENOUS
  Filled 2018-05-23: qty 250

## 2018-05-23 NOTE — Progress Notes (Signed)
Patient here today for follow up.  Patient c/o not eating well due to unappetizing food at living facility, [facility].

## 2018-05-23 NOTE — Progress Notes (Signed)
[place]  Telephone:([phone] [phone]) [phone]   Name: Keith Daniels Date: 05/23/2018 MRN: [id number]  DOB: 1950/06/18  Patient Care Team: [name], MD as PCP - General (Family Medicine)    REASON FOR CONSULTATION: Palliative Care consult requested for this 68 y.o. 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 PMH also notable for history of HCV, HTN, Alcohol abuse, and IVDU. Patient is a resident at [facility] due to previous TBI. He is wheelchair bound at baseline. Palliative care was consulted to help provide supportive care.    SOCIAL HISTORY:     reports that he quit smoking about 13 months ago. His smoking use included cigarettes. He has a 10.00 pack-year smoking history. He has never used smokeless tobacco. He reports previous alcohol use. He reports previous drug use. Drug: "Crack" cocaine.   Patient is a resident at [facility]. He has a brother and sister-in-law who are involved but live in [place], [place]. Patient has another sister who is also in poor health. Patient is not married and does not have children.   ADVANCE DIRECTIVES:  MOST form completed on 05/11/18  CODE STATUS: DNR  PAST MEDICAL HISTORY: Past Medical History:  Diagnosis Date  . Alcohol dependence in remission ([place])   . Chewing tobacco nicotine dependence, uncomplicated   . Chronic viral hepatitis C ([place])   . Cognitive communication deficit   . 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 Dysphagia, oropharyngeal phase   . Essential hypertension   . Extended spectrum beta lactamase (ESBL) resistance   . Gross hematuria   . History of traumatic brain injury   . Major depressive disorder   . Metastatic renal cell carcinoma ([place]) 05/11/2018  . Muscle weakness (generalized)   . Unspecified severe protein-calorie malnutrition ([place])   . Urinary tract infection, site not specified      PAST SURGICAL HISTORY: No past surgical history on file.  HEMATOLOGY/ONCOLOGY HISTORY:    Metastatic renal cell carcinoma ([place])   05/11/2018 Initial Diagnosis    Metastatic renal cell carcinoma ([place])    05/16/2018 -  Chemotherapy    The patient had pembrolizumab (KEYTRUDA) 200 mg in sodium chloride 0.9 % 50 mL chemo infusion, 200 mg, Intravenous, Once, 1 of 6 cycles Administration: 200 mg (05/16/2018)  for chemotherapy treatment.      ALLERGIES:  has No Known Allergies.  MEDICATIONS:  Current Outpatient Medications  Medication Sig Dispense Refill  . axitinib (INLYTA) 5 MG tablet Take 1 tablet (5 mg total) by mouth 2 (two) times daily. 60 tablet 0  . escitalopram (LEXAPRO) 10 MG tablet Take 10 mg by mouth daily.    [name] LORazepam (ATIVAN) 2 MG/ML injection Inject into the vein.    . Melatonin 300 MCG TABS Take 1.5 tablets by mouth at bedtime.     . MULTIPLE VITAMIN PO Take 1 tablet by mouth daily.    . Nutritional Supplements (ENSURE NUTRITION SHAKE) LIQD Take 8 fluid ounces by mouth 2 (two) times daily. 60 Bottle 11  . ondansetron (ZOFRAN) 8 MG tablet Take 1 tablet (8 mg total) by mouth 2 (two) times daily. 20 tablet 2  . thiamine (VITAMIN B-1) 100 MG tablet Take 100 mg by mouth daily.    . traMADol (ULTRAM) 50 MG tablet Take by mouth every 12 (twelve) hours as needed.     No current facility-administered medications for this visit.  VITAL SIGNS: There were no vitals taken for this visit. There were no vitals filed for this visit.  Estimated body mass index is 19.18 kg/m as calculated from the following:   Height as of 04/16/18: 6' (1.829 m).   Weight as of 05/16/18: 141 lb 6.4 oz (64.1 kg).  LABS: CBC:    Component Value Date/Time   WBC 2.9 (L) 05/23/2018 1345   HGB 11.8 (L) 05/23/2018 1345   HCT 36.6 (L) 05/23/2018 1345   PLT 177 05/23/2018 1345   MCV 85.9 05/23/2018 1345   NEUTROABS 1.9 05/23/2018 1345   LYMPHSABS 0.7 05/23/2018 1345   MONOABS 0.3 05/23/2018  1345   EOSABS 0.1 05/23/2018 1345   BASOSABS 0.0 05/23/2018 1345   Comprehensive Metabolic Panel:    Component Value Date/Time   NA 139 05/23/2018 1345   K 4.4 05/23/2018 1345   CL 104 05/23/2018 1345   CO2 29 05/23/2018 1345   BUN 25 (H) 05/23/2018 1345   CREATININE 1.27 (H) 05/23/2018 1345   GLUCOSE 125 (H) 05/23/2018 1345   CALCIUM 9.9 05/23/2018 1345   AST 21 05/23/2018 1345   ALT 19 05/23/2018 1345   ALKPHOS 74 05/23/2018 1345   BILITOT 0.5 05/23/2018 1345   PROT 8.0 05/23/2018 1345   ALBUMIN 3.3 (L) 05/23/2018 1345    RADIOGRAPHIC STUDIES: Ct Chest W Contrast  Result Date: 05/03/2018 CLINICAL DATA:  Malignant left renal mass with left para-aortic adenopathy, biopsy of which demonstrated "poorly differentiated metastatic carcinoma, favor renal cell carcinoma" on 04/16/2018. Chest staging. EXAM: CT CHEST WITH CONTRAST TECHNIQUE: Multidetector CT imaging of the chest was performed during intravenous contrast administration. CONTRAST:  37mL OMNIPAQUE IOHEXOL 300 MG/ML  SOLN COMPARISON:  03/09/2018 CT abdomen/pelvis. FINDINGS: Cardiovascular: Normal heart size. Small pericardial effusion. Three-vessel coronary atherosclerosis. Mildly atherosclerotic nonaneurysmal thoracic aorta. Normal caliber pulmonary arteries. No central pulmonary emboli. Mediastinum/Nodes: Mildly heterogeneous thyroid gland without discrete thyroid nodules. Unremarkable esophagus. No pathologically enlarged axillary, mediastinal or hilar lymph nodes. Lungs/Pleura: No pneumothorax. No pleural effusion. Moderate centrilobular and paraseptal emphysema. No acute consolidative airspace disease or lung masses. Numerous (at least 12) solid pulmonary nodules scattered in both lungs, largest 7 mm in the right upper lobe (series 3/image 44) and 6 mm in the left lower lobe (series 3/image 82). Upper abdomen: Partially visualized heterogeneously enhancing left renal pelvis mass measuring 6.5 cm (series 2/image 172), increased from  5.8 cm on 03/09/2018 CT. Partially visualized infiltrative left para-aortic adenopathy measuring at least 5.0 cm short axis diameter (series 2/image 177), increased from 3.7 cm. Nonspecific hypervascular 1.0 cm focus in the segment 3 left liver lobe (series 2/image 169), not definitely seen on prior CT, possibly due to differences in contrast timing. Musculoskeletal: No aggressive appearing focal osseous lesions. Symmetric mild gynecomastia. Mild thoracic spondylosis. IMPRESSION: 1. Numerous (at least 12) solid subcentimeter pulmonary nodules in both lungs, suspicious for pulmonary metastases. 2. Interval growth of malignant left renal pelvis mass. 3. Interval growth of left para-aortic infiltrative metastatic adenopathy. 4. Small pericardial effusion. 5. Three-vessel coronary atherosclerosis. Aortic Atherosclerosis (ICD10-I70.0) and Emphysema (ICD10-J43.9). Electronically Signed   By: Ilona Sorrel M.D.   On: 05/03/2018 15:38    PERFORMANCE STATUS (ECOG) : 3 - Symptomatic, >50% confined to bed  Review of Systems As noted above. Otherwise, a complete review of systems is negative.  Physical Exam General: NAD, frail appearing, thin, in wheelchair HEENT: poor dentition Cardiovascular: regular rate and rhythm Pulmonary: clear ant fields, tender to touch L. ribs Abdomen: soft, nontender, +  bowel sounds GU: no suprapubic tenderness Extremities: no edema Skin: no rashes Neurological: Weakness but otherwise nonfocal  IMPRESSION: Patient seen today in the clinic for routine follow-up.  He is lost weight since last seen.  He also appears to be dehydrated today and has AKI with serum creatinine of 1.27.  He received IV fluids today in the clinic.  Patient says that he is drinking 2-3 pre-thickened beverages per day.  I reviewed his diet order and it appears that he is eating a regular diet but has thickened liquids.  I question if he needs thickened liquids. I would recommend reevaluation by speech  therapy at the facility.  I would also recommend a nutrition consult with a dietitian.  Patient has some point tenderness to the left rib area.  He says the tramadol he is receiving at the facility is helping but he is not receiving frequently enough.  I called and spoke with Areta Haber, NP with [facility]. She will schedule the tramadol. We will order bone scan.   I note that patient appears to be a full code on orders sent from facility. I had previously completed a MOST form (on 05/11/18) detailing DNR/Limited scope of treatment. The [employer] NP says that facility cannot locate this form. Will probably require completion of a new MOST form, which is complicated by his brother living in [place].   Additionally, I would recommend palliative care consult at the facility with Natalia Leatherwood, NP.   PLAN: -Continue current scope of treatment -IV fluids today in clinic -Recommend scheduled tramadol for pain -Bone scan per Dr. Tasia Catchings -Recommend ST consult -Recommend RD consult -Push PO fluids at facility (please provide him with liberal access to fluids) -Please provide patient with oral care TID -Oral supplements TID -Palliative Care consult at facility -Will likely need new MOST form completed  Patient expressed understanding and was in agreement with this plan. He also understands that He can call clinic at any time with any questions, concerns, or complaints.   Time Total: 30 minutes  Visit consisted of counseling and education dealing with the complex and emotionally intense issues of symptom management and palliative care in the setting of serious and potentially life-threatening illness.Greater than 50%  of this time was spent counseling and coordinating care related to the above assessment and plan.  Signed by: Altha Harm, PhD, NP-C [phone] (Work Cell)

## 2018-05-24 ENCOUNTER — Non-Acute Institutional Stay: Payer: Medicare Other | Admitting: Nurse Practitioner

## 2018-05-24 ENCOUNTER — Encounter: Payer: Self-pay | Admitting: Nurse Practitioner

## 2018-05-24 ENCOUNTER — Inpatient Hospital Stay: Payer: Medicare Other

## 2018-05-24 ENCOUNTER — Encounter: Payer: Self-pay | Admitting: Oncology

## 2018-05-24 VITALS — BP 132/72 | HR 82 | Temp 97.0°F | Resp 18 | Wt 138.4 lb

## 2018-05-24 DIAGNOSIS — Z515 Encounter for palliative care: Secondary | ICD-10-CM | POA: Insufficient documentation

## 2018-05-24 DIAGNOSIS — D649 Anemia, unspecified: Secondary | ICD-10-CM | POA: Insufficient documentation

## 2018-05-24 DIAGNOSIS — R531 Weakness: Secondary | ICD-10-CM | POA: Insufficient documentation

## 2018-05-24 DIAGNOSIS — R630 Anorexia: Secondary | ICD-10-CM

## 2018-05-24 NOTE — Progress Notes (Signed)
Hematology/Oncology follow up  note [facility] [phone] [phone]   Patient Care Team: [name], MD as PCP - General (Family Medicine)  REFERRING PROVIDER: Dr.Stoiff REASON FOR VISIT:  Management of RCC  HISTORY OF PRESENTING ILLNESS:  Keith Daniels is a  68 y.o.  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  Patient has history of traumatic brain injury long term resident at [facility]. History of HCV, HTN, Alcohol abuse, IVDU.  Former smoker.  Patient was evaluated by Urology Dr.Stoiff due to hematuria for the past few weeks, associated with dysuria, increased frequency and urgency. Patient was treated with Omnicef 300mg  BID x 10 days empirically. Urine culture later ESBL, microbiology results not available to me. Per NH records, patient has been on Imipenem 500mg  Q6 hours. Per patient, he is finished the course today.   02/20/2018 US renal showed Abnormal appearance of the lower pole of the right kidney suspicious for a mass either neoplastic or infectious. There is moderate hydronephrosis on the left. Renal protocol MRI would be a useful next imaging step. Normal appearing right kidney. The urinary bladder has a small postvoid residual volume.  03/09/2018 CT hematuria work up showed 1. Large mass in the left renal pelvis involving the lower and interpolar regions of the left kidney, with some invasion of the renal parenchyma in the lower pole of the left kidney, highly concerning for primary urothelial neoplasm. This is associated with extensive retroperitoneal lymphadenopathy, most evident in the left para-aortic nodal station adjacent to the left renal hilum, as above. Filling defects in the urinary bladder discussed above, favored to represent clots, although additional foci of urothelial neoplasm is not entirely excluded. 2. Aortic  atherosclerosis. 3. Additional incidental findings, as above.  Patient was referred to cancer center for further evaluation.  Patient reports feeling weak and fatigue for past few months. His has poor appetite and has had weight loss. Also reports generalized pain, not able to verbalize details.  Accompanied by brother Jenny Reichmann who is his POA  INTERVAL HISTORY Keith [facility] is a 68 y.o. male who has above history reviewed by me today presents for follow up visit for evaluation of tolerability of anti-neoplasm therapy with Keytruda and Axitinib on 05/16/2018.   He has lost significant amount of weight, 8 pounds since last week.  Patient tells me that food at his living facility is not good, does not have much taste so he does not eat much.  He reports increased left rib cage pain,, takes Tamadol 50mg  Q6h as needed.  Poor historian due to history of traumatic brain injury. .    Review of Systems  Constitutional: Positive for fatigue and unexpected weight change. Negative for appetite change, chills and fever.  HENT:   Negative for hearing loss and voice change.   Eyes: Negative for eye problems and icterus.  Respiratory: Negative for chest tightness, cough and shortness of breath.   Cardiovascular: Negative for chest pain and leg swelling.  Gastrointestinal: Negative for abdominal distention and abdominal pain.  Endocrine: Negative for hot flashes.  Genitourinary: Negative for difficulty urinating, dysuria and frequency.   Musculoskeletal: Negative for arthralgias.       Left anterior chest wall "knot", intermittent pain.   Skin: Negative for itching and rash.  Neurological: Negative for light-headedness and numbness.  Hematological: Negative for adenopathy. Does not bruise/bleed easily.  Psychiatric/Behavioral: Negative for confusion.  MEDICAL HISTORY:  Past Medical History:  Diagnosis Date  . Alcohol dependence in remission ([place])   . Chewing tobacco nicotine dependence, uncomplicated    . Chronic viral hepatitis C ([place])   . Cognitive communication deficit   . Diffuse traumatic brain injury ([place])   . Dysphagia, oropharyngeal phase   . Essential hypertension   . Extended spectrum beta lactamase (ESBL) resistance   . Gross hematuria   . History of traumatic brain injury   . Major depressive disorder   . Metastatic renal cell carcinoma ([place]) 05/11/2018  . Muscle weakness (generalized)   . Unspecified severe protein-calorie malnutrition ([place])   . Urinary tract infection, site not specified     SURGICAL HISTORY: History reviewed. No pertinent surgical history.  SOCIAL HISTORY: Social History   Socioeconomic History  . Marital status: Single    Spouse name: Not on file  . Number of children: Not on file  . Years of education: Not on file  . Highest education level: Not on file  Occupational History  . Not on file  Social Needs  . Financial resource strain: Not on file  . Food insecurity:    Worry: Not on file    Inability: Not on file  . Transportation needs:    Medical: Not on file    Non-medical: Not on file  Tobacco Use  . Smoking status: Former Smoker    Packs/day: 1.00    Years: 10.00    Pack years: 10.00    Types: Cigarettes    Last attempt to quit: 04/03/2017    Years since quitting: 1.1  . Smokeless tobacco: Never Used  Substance and Sexual Activity  . Alcohol use: Not Currently    Comment: quit in 2019  . Drug use: Not Currently    Types: "Crack" cocaine    Comment: quit in 2019  . Sexual activity: Yes  Lifestyle  . Physical activity:    Days per week: Not on file    Minutes per session: Not on file  . Stress: Not on file  Relationships  . Social connections:    Talks on phone: Not on file    Gets together: Not on file    Attends religious service: Not on file    Active member of club or organization: Not on file    Attends meetings of clubs or organizations: Not on file    Relationship status: Not on file  . Intimate partner  violence:    Fear of current or ex partner: Not on file    Emotionally abused: Not on file    Physically abused: Not on file    Forced sexual activity: Not on file  Other Topics Concern  . Not on file  Social History Narrative  . Not on file    FAMILY HISTORY: Family History  Problem Relation Age of Onset  . Kidney disease Mother   . Hypertension Brother   . Prostate cancer Brother     ALLERGIES:  has No Known Allergies.  MEDICATIONS:  Current Outpatient Medications  Medication Sig Dispense Refill  . axitinib (INLYTA) 5 MG tablet Take 1 tablet (5 mg total) by mouth 2 (two) times daily. 60 tablet 0  . escitalopram (LEXAPRO) 10 MG tablet Take 10 mg by mouth daily.    [name] LORazepam (ATIVAN) 2 MG/ML injection Inject into the vein.    . Melatonin 300 MCG TABS Take 1.5 tablets by mouth at bedtime.     . MULTIPLE VITAMIN PO Take  1 tablet by mouth daily.    . Nutritional Supplements (ENSURE NUTRITION SHAKE) LIQD Take 8 fluid ounces by mouth 2 (two) times daily. 60 Bottle 11  . ondansetron (ZOFRAN) 8 MG tablet Take 1 tablet (8 mg total) by mouth 2 (two) times daily. 20 tablet 2  . thiamine (VITAMIN B-1) 100 MG tablet Take 100 mg by mouth daily.    . traMADol (ULTRAM) 50 MG tablet Take by mouth every 12 (twelve) hours as needed.     No current facility-administered medications for this visit.      PHYSICAL EXAMINATION: ECOG PERFORMANCE STATUS: 3 - Symptomatic, >50% confined to bed Vitals:   05/23/18 1641  BP: 131/82  Pulse: 72  Temp: (!) 95.6 F (35.3 C)   Filed Weights   05/23/18 1641  Weight: 133 lb 7 oz (60.5 kg)    Physical Exam Constitutional:      General: He is not in acute distress.    Appearance: He is ill-appearing.     Comments: Sitting in wheelchair.   HENT:     Head: Normocephalic and atraumatic.     Mouth/Throat:     Comments: Poor dentition.  Eyes:     General: No scleral icterus.    Pupils: Pupils are equal, round, and reactive to light.  Neck:      Musculoskeletal: Normal range of motion and neck supple.  Cardiovascular:     Rate and Rhythm: Normal rate and regular rhythm.     Heart sounds: Normal heart sounds.  Pulmonary:     Effort: Pulmonary effort is normal. No respiratory distress.     Breath sounds: No wheezing.  Abdominal:     General: Bowel sounds are normal. There is no distension.     Palpations: Abdomen is soft. There is no mass.     Tenderness: There is no abdominal tenderness.  Musculoskeletal: Normal range of motion.        General: No deformity.     Comments: Decreased bilateral lower extremity muscle strength. 2-3/5  Skin:    General: Skin is warm and dry.     Findings: No erythema or rash.  Neurological:     Mental Status: He is alert and oriented to person, place, and time.     Cranial Nerves: No cranial nerve deficit.     Coordination: Coordination normal.  Psychiatric:        Behavior: Behavior normal.        Thought Content: Thought content normal.      LABORATORY DATA:  I have reviewed the data as listed Lab Results  Component Value Date   WBC 2.9 (L) 05/23/2018   HGB 11.8 (L) 05/23/2018   HCT 36.6 (L) 05/23/2018   MCV 85.9 05/23/2018   PLT 177 05/23/2018   Recent Labs    04/03/18 1206 05/16/18 0850 05/23/18 1345  NA 141 140 139  K 4.1 3.8 4.4  CL 106 106 104  CO2 28 26 29   GLUCOSE 133* 136* 125*  BUN 12 12 25*  CREATININE 1.17 1.18 1.27*  CALCIUM 10.9* 9.9 9.9  GFRNONAA >60 >60 58*  GFRAA >60 >60 >60  PROT 7.9 7.6 8.0  ALBUMIN 4.0 3.7 3.3*  AST 20 18 21   ALT 12 13 19   ALKPHOS 79 68 74  BILITOT 0.5 0.4 0.5   Iron/TIBC/Ferritin/ %Sat No results found for: IRON, TIBC, FERRITIN, IRONPCTSAT      ASSESSMENT & PLAN:  1. Metastatic renal cell carcinoma, unspecified laterality ([place])   2.  Malignant neoplasm of left kidney excluding renal pelvis (HCC)   3. Goals of care, counseling/discussion   4. Hypercalcemia   5. Normocytic anemia   6. Abnormal TSH   cT3N1M1, Stage IV RCC.  IMDC poor risk S/p Cycle 1 Keytruda, patient has been on Axitinib since 05/16/2018.  Blood pressure stable.   AKI, creatinine increased to 1.27, likely due to poor oral intake.  Recommend to proceed with IV normal saline 1 L daily for 3 days Repeat labs in 1 week #Anemia, stable hemoglobin.  Continue to monitor. # Protein calorie malnutrition: ensure supplements BID. #Suppressed TSH at baseline, will check free T4 at next visit.  #Refer to palliative care and establish care with [facility].   Earlie Server, MD, PhD Hematology Oncology [facility] at [facility] Pager- [phone] 05/24/2018

## 2018-05-24 NOTE — Progress Notes (Signed)
[facility] Consult Note Telephone: [phone]  Fax: [phone]  PATIENT NAME: Keith [facility] DOB: 1951-01-15 MRN: [id number]  PRIMARY Daniels PROVIDER:   [name], MD  REFERRING PROVIDER:  Dr [facility] RESPONSIBLE PARTY:   Keith Daniels, brother [phone]  I was verbally asked by [facility] to see for code status, goc for [facility] consult  RECOMMENDATIONS and PLAN:  1. Palliative Daniels encounter Z51.5; Palliative medicine team will continue to support patient, patient's family, and medical team. Visit consisted of counseling and education dealing with the complex and emotionally intense issues of symptom management and palliative Daniels in the setting of serious and potentially life-threatening illness  2. Generalized weakness R53.1  secondary to metatastic renal cell carcinoma, continue with therapy as able. Encourage energy conservation and rest times.  3. Anorexia R63.0 secondary to protein calorie malnutrition  secondary to metatastic renal cell carcinoma. Continue to monitor daily weights, supplements, supportive measures and courage to eat  ASSESSMENT:     I visited and observed [facility]. We talked about purpose for palliative Daniels visit. We talked about how he was feeling today. He verbalize that he's doing good. We talked about symptoms of pain which he denies. We talked about shortness of breath and he denies. Asked if he was hungry and he replied no. We talked about what he ate today. He does seem mildly cognitively impaired with limited answers to more complex questions. We talked about working with therapy and what he's been able to do. We talked about residing at [facility] and he shared that the staff is nice to him. Emotional support provided. He was cooperative with assessment. I have attempted to contact his brother for further discussion of most form, goals of Daniels. I have  to the nursing staff in any changes at present time to goes our current plan of Daniels.  2 / 26 / 2020 sodium 139, potassium 4.4, chloride 104, CO2 29, calcium 9.9, bun 25, creatinine 1.27, glucose 125, albumin 3.3, total protein 8.0, WBC 2.9, hemoglobin 11.8, hematocrit 36.6, platelets 117  I spent 75 minutes providing this consultation,  From 10:00am to 11:30am. More than 50% of the time in this consultation was spent coordinating communication.   HISTORY OF PRESENT ILLNESS:  Keith Daniels is a 68 y.o. year old male with multiple medical problems including metastatic renal cell carcinoma with metastasis to lung with axitinib with pembrolizumab, traumatic brain injury, dysphasia, hypertension, severe protein calorie malnutrition, viral hepatitis C, tobacco abuse quit 13 months ago, alcohol dependency in remission,  IV drug use including crack cocaine. Sing by palliative [facility] at [facility] 2 / 26 / 2020 as he is a resident at [facility] and his brother and sister-in-law involved but they live in [place]. He is not married and does not have children. Documented most form completed on 2/14 / 2020 with DNR in place. For documentation he continues to lose weight appear dehydrated at visit with acute kidney injury with serum creatinine of 1.27 received IV fluids in the clinic. He had point tenderness to left rib area with Tramadol for pain control which was PRN though not effective and tramadol recommendation to be scheduled which it was upon return to [facility]. A bone scan was ordered per Dr Keith [facility], Oncology recommendation for speech, dietitian, continue current scope of treatment with oral supplements. [name] was discharged to short-term rehab at  [facility] for therapy. He does require assistance for adl's, transferring. He does require assistance for tray set up an appetite remains poor. He is able to verbalize his needs for staff. At  present he is lying in bed. He appears to debilitated but comfortable. No visitors present. Palliative Daniels was asked to help address goals of Daniels.   CODE STATUS: full code  PPS: 40% HOSPICE ELIGIBILITY/DIAGNOSIS: TBD  PAST MEDICAL HISTORY:  Past Medical History:  Diagnosis Date  . Alcohol dependence in remission ([facility])   . Chewing tobacco nicotine dependence, uncomplicated   . Chronic viral hepatitis C ([place])   . Cognitive communication deficit   . Diffuse traumatic brain injury ([facility])   . Dysphagia, oropharyngeal phase   . Essential hypertension   . Extended spectrum beta lactamase (ESBL) resistance   . Gross hematuria   . History of traumatic brain injury   . Major depressive disorder   . Metastatic renal cell carcinoma ([facility]) 05/11/2018  . Muscle weakness (generalized)   . Unspecified severe protein-calorie malnutrition ([facility] Daniels)   . Urinary tract infection, site not specified     SOCIAL HX:  Social History   Tobacco Use  . Smoking status: Former Smoker    Packs/day: 1.00    Years: 10.00    Pack years: 10.00    Types: Cigarettes    Last attempt to quit: 04/03/2017    Years since quitting: 1.1  . Smokeless tobacco: Never Used  Substance Use Topics  . Alcohol use: Not Currently    Comment: quit in 2019    ALLERGIES: No Known Allergies   PERTINENT MEDICATIONS:  Outpatient Encounter Medications as of 05/24/2018  Medication Sig  . axitinib (INLYTA) 5 MG tablet Take 1 tablet (5 mg total) by mouth 2 (two) times daily.  [name] escitalopram (LEXAPRO) 10 MG tablet Take 10 mg by mouth daily.  [name] LORazepam (ATIVAN) 2 MG/ML injection Inject into the vein.  . Melatonin 300 MCG TABS Take 1.5 tablets by mouth at bedtime.   . MULTIPLE VITAMIN PO Take 1 tablet by mouth daily.  . Nutritional Supplements (ENSURE NUTRITION SHAKE) LIQD Take 8 fluid ounces by mouth 2 (two) times daily.  . ondansetron (ZOFRAN) 8 MG tablet Take 1 tablet (8 mg total) by mouth 2 (two) times daily.  [name] thiamine (VITAMIN  B-1) 100 MG tablet Take 100 mg by mouth daily.  . traMADol (ULTRAM) 50 MG tablet Take by mouth every 12 (twelve) hours as needed.   No facility-administered encounter medications on file as of 05/24/2018.     PHYSICAL EXAM:   General: frail appearing, thin, pleasant male Cardiovascular: regular rate and rhythm Pulmonary: clear ant fields Abdomen: soft, nontender, + bowel sounds GU: no suprapubic tenderness Extremities: no edema, no joint deformities Skin: no rashes Neurological: Weakness but otherwise nonfocal  Zolton Dowson Ihor Gully, Keith

## 2018-05-25 ENCOUNTER — Inpatient Hospital Stay: Payer: Medicare Other

## 2018-05-25 ENCOUNTER — Inpatient Hospital Stay: Payer: Medicare Other | Admitting: Hospice and Palliative Medicine

## 2018-05-25 ENCOUNTER — Telehealth: Payer: Self-pay | Admitting: *Deleted

## 2018-05-25 NOTE — Telephone Encounter (Signed)
[employer] Making transportation aware of the scheduled times for the lab/MD/IVF on 05/29/18 A detailed message was left on ex:[phone]

## 2018-05-27 ENCOUNTER — Emergency Department: Payer: Medicare Other

## 2018-05-27 ENCOUNTER — Inpatient Hospital Stay
Admission: EM | Admit: 2018-05-27 | Discharge: 2018-06-27 | DRG: 193 | Disposition: E | Payer: Medicare Other | Attending: Internal Medicine | Admitting: Internal Medicine

## 2018-05-27 ENCOUNTER — Inpatient Hospital Stay: Payer: Medicare Other

## 2018-05-27 DIAGNOSIS — R64 Cachexia: Secondary | ICD-10-CM | POA: Diagnosis present

## 2018-05-27 DIAGNOSIS — R1310 Dysphagia, unspecified: Secondary | ICD-10-CM | POA: Diagnosis present

## 2018-05-27 DIAGNOSIS — Z66 Do not resuscitate: Secondary | ICD-10-CM | POA: Diagnosis present

## 2018-05-27 DIAGNOSIS — Z9221 Personal history of antineoplastic chemotherapy: Secondary | ICD-10-CM

## 2018-05-27 DIAGNOSIS — E860 Dehydration: Secondary | ICD-10-CM | POA: Diagnosis present

## 2018-05-27 DIAGNOSIS — R590 Localized enlarged lymph nodes: Secondary | ICD-10-CM | POA: Diagnosis present

## 2018-05-27 DIAGNOSIS — F329 Major depressive disorder, single episode, unspecified: Secondary | ICD-10-CM | POA: Diagnosis present

## 2018-05-27 DIAGNOSIS — C641 Malignant neoplasm of right kidney, except renal pelvis: Secondary | ICD-10-CM | POA: Diagnosis present

## 2018-05-27 DIAGNOSIS — Z79891 Long term (current) use of opiate analgesic: Secondary | ICD-10-CM

## 2018-05-27 DIAGNOSIS — I959 Hypotension, unspecified: Secondary | ICD-10-CM | POA: Diagnosis not present

## 2018-05-27 DIAGNOSIS — R0902 Hypoxemia: Secondary | ICD-10-CM | POA: Diagnosis not present

## 2018-05-27 DIAGNOSIS — G9341 Metabolic encephalopathy: Secondary | ICD-10-CM | POA: Diagnosis present

## 2018-05-27 DIAGNOSIS — Z9079 Acquired absence of other genital organ(s): Secondary | ICD-10-CM

## 2018-05-27 DIAGNOSIS — E870 Hyperosmolality and hypernatremia: Secondary | ICD-10-CM | POA: Diagnosis present

## 2018-05-27 DIAGNOSIS — R41841 Cognitive communication deficit: Secondary | ICD-10-CM | POA: Diagnosis present

## 2018-05-27 DIAGNOSIS — R410 Disorientation, unspecified: Secondary | ICD-10-CM

## 2018-05-27 DIAGNOSIS — Z515 Encounter for palliative care: Secondary | ICD-10-CM | POA: Diagnosis not present

## 2018-05-27 DIAGNOSIS — J189 Pneumonia, unspecified organism: Secondary | ICD-10-CM | POA: Diagnosis present

## 2018-05-27 DIAGNOSIS — R1312 Dysphagia, oropharyngeal phase: Secondary | ICD-10-CM | POA: Diagnosis present

## 2018-05-27 DIAGNOSIS — Z7401 Bed confinement status: Secondary | ICD-10-CM

## 2018-05-27 DIAGNOSIS — J9601 Acute respiratory failure with hypoxia: Secondary | ICD-10-CM | POA: Diagnosis present

## 2018-05-27 DIAGNOSIS — N179 Acute kidney failure, unspecified: Secondary | ICD-10-CM | POA: Diagnosis present

## 2018-05-27 DIAGNOSIS — C649 Malignant neoplasm of unspecified kidney, except renal pelvis: Secondary | ICD-10-CM | POA: Diagnosis not present

## 2018-05-27 DIAGNOSIS — B182 Chronic viral hepatitis C: Secondary | ICD-10-CM | POA: Diagnosis present

## 2018-05-27 DIAGNOSIS — I10 Essential (primary) hypertension: Secondary | ICD-10-CM | POA: Diagnosis present

## 2018-05-27 DIAGNOSIS — Z681 Body mass index (BMI) 19 or less, adult: Secondary | ICD-10-CM

## 2018-05-27 DIAGNOSIS — E43 Unspecified severe protein-calorie malnutrition: Secondary | ICD-10-CM | POA: Diagnosis present

## 2018-05-27 DIAGNOSIS — I472 Ventricular tachycardia: Secondary | ICD-10-CM | POA: Diagnosis not present

## 2018-05-27 DIAGNOSIS — Z841 Family history of disorders of kidney and ureter: Secondary | ICD-10-CM

## 2018-05-27 DIAGNOSIS — I4891 Unspecified atrial fibrillation: Secondary | ICD-10-CM | POA: Diagnosis not present

## 2018-05-27 DIAGNOSIS — E875 Hyperkalemia: Secondary | ICD-10-CM | POA: Diagnosis present

## 2018-05-27 DIAGNOSIS — Z7189 Other specified counseling: Secondary | ICD-10-CM | POA: Diagnosis not present

## 2018-05-27 DIAGNOSIS — F1021 Alcohol dependence, in remission: Secondary | ICD-10-CM | POA: Diagnosis present

## 2018-05-27 DIAGNOSIS — Z87820 Personal history of traumatic brain injury: Secondary | ICD-10-CM

## 2018-05-27 DIAGNOSIS — C7800 Secondary malignant neoplasm of unspecified lung: Secondary | ICD-10-CM | POA: Diagnosis present

## 2018-05-27 DIAGNOSIS — Z79899 Other long term (current) drug therapy: Secondary | ICD-10-CM

## 2018-05-27 DIAGNOSIS — Z8249 Family history of ischemic heart disease and other diseases of the circulatory system: Secondary | ICD-10-CM

## 2018-05-27 DIAGNOSIS — Z87891 Personal history of nicotine dependence: Secondary | ICD-10-CM

## 2018-05-27 DIAGNOSIS — R310 Gross hematuria: Secondary | ICD-10-CM | POA: Diagnosis present

## 2018-05-27 DIAGNOSIS — Z1612 Extended spectrum beta lactamase (ESBL) resistance: Secondary | ICD-10-CM | POA: Diagnosis present

## 2018-05-27 DIAGNOSIS — R001 Bradycardia, unspecified: Secondary | ICD-10-CM | POA: Diagnosis present

## 2018-05-27 LAB — BASIC METABOLIC PANEL
Anion gap: 7 (ref 5–15)
BUN: 38 mg/dL — ABNORMAL HIGH (ref 8–23)
CO2: 24 mmol/L (ref 22–32)
Calcium: 8.8 mg/dL — ABNORMAL LOW (ref 8.9–10.3)
Chloride: 114 mmol/L — ABNORMAL HIGH (ref 98–111)
Creatinine, Ser: 1.59 mg/dL — ABNORMAL HIGH (ref 0.61–1.24)
GFR calc Af Amer: 52 mL/min — ABNORMAL LOW (ref >60)
GFR, EST NON AFRICAN AMERICAN: 45 mL/min — AB (ref >60)
GLUCOSE: 107 mg/dL — AB (ref 70–99)
Potassium: 5.5 mmol/L — ABNORMAL HIGH (ref 3.5–5.1)
Sodium: 145 mmol/L (ref 135–145)

## 2018-05-27 LAB — COMPREHENSIVE METABOLIC PANEL
ALK PHOS: 54 U/L (ref 38–126)
ALT: 81 U/L — ABNORMAL HIGH (ref 0–44)
ANION GAP: 8 (ref 5–15)
AST: 74 U/L — ABNORMAL HIGH (ref 15–41)
Albumin: 3 g/dL — ABNORMAL LOW (ref 3.5–5.0)
BUN: 41 mg/dL — ABNORMAL HIGH (ref 8–23)
CALCIUM: 9.1 mg/dL (ref 8.9–10.3)
CO2: 24 mmol/L (ref 22–32)
Chloride: 109 mmol/L (ref 98–111)
Creatinine, Ser: 1.72 mg/dL — ABNORMAL HIGH (ref 0.61–1.24)
GFR calc Af Amer: 47 mL/min — ABNORMAL LOW (ref >60)
GFR calc non Af Amer: 41 mL/min — ABNORMAL LOW (ref >60)
Glucose, Bld: 135 mg/dL — ABNORMAL HIGH (ref 70–99)
Potassium: 5.3 mmol/L — ABNORMAL HIGH (ref 3.5–5.1)
Sodium: 141 mmol/L (ref 135–145)
TOTAL PROTEIN: 7.1 g/dL (ref 6.5–8.1)
Total Bilirubin: 0.9 mg/dL (ref 0.3–1.2)

## 2018-05-27 LAB — CBC
HCT: 33.1 % — ABNORMAL LOW (ref 39.0–52.0)
Hemoglobin: 10.5 g/dL — ABNORMAL LOW (ref 13.0–17.0)
MCH: 27.4 pg (ref 26.0–34.0)
MCHC: 31.7 g/dL (ref 30.0–36.0)
MCV: 86.4 fL (ref 80.0–100.0)
Platelets: 172 10*3/uL (ref 150–400)
RBC: 3.83 MIL/uL — ABNORMAL LOW (ref 4.22–5.81)
RDW: 15.9 % — ABNORMAL HIGH (ref 11.5–15.5)
WBC: 7 10*3/uL (ref 4.0–10.5)
nRBC: 0 % (ref 0.0–0.2)

## 2018-05-27 LAB — INFLUENZA PANEL BY PCR (TYPE A & B)
Influenza A By PCR: NEGATIVE
Influenza B By PCR: NEGATIVE

## 2018-05-27 LAB — TROPONIN I: TROPONIN I: 0.59 ng/mL — AB (ref <0.03)

## 2018-05-27 LAB — BRAIN NATRIURETIC PEPTIDE: B Natriuretic Peptide: 1059 pg/mL — ABNORMAL HIGH (ref 0.0–100.0)

## 2018-05-27 MED ORDER — SODIUM CHLORIDE 0.9 % IV SOLN
500.0000 mg | Freq: Once | INTRAVENOUS | Status: AC
  Administered 2018-05-27: 500 mg via INTRAVENOUS
  Filled 2018-05-27: qty 500

## 2018-05-27 MED ORDER — VITAMIN B-1 100 MG PO TABS
100.0000 mg | ORAL_TABLET | Freq: Every day | ORAL | Status: DC
  Filled 2018-05-27: qty 1

## 2018-05-27 MED ORDER — MORPHINE SULFATE (PF) 2 MG/ML IV SOLN
2.0000 mg | INTRAVENOUS | Status: DC | PRN
  Administered 2018-05-28 (×4): 2 mg via INTRAVENOUS
  Filled 2018-05-27 (×4): qty 1

## 2018-05-27 MED ORDER — ADULT MULTIVITAMIN W/MINERALS CH
1.0000 | ORAL_TABLET | Freq: Every day | ORAL | Status: DC
  Filled 2018-05-27: qty 1

## 2018-05-27 MED ORDER — HEPARIN SODIUM (PORCINE) 5000 UNIT/ML IJ SOLN
5000.0000 [IU] | Freq: Three times a day (TID) | INTRAMUSCULAR | Status: DC
  Administered 2018-05-27 – 2018-05-30 (×8): 5000 [IU] via SUBCUTANEOUS
  Filled 2018-05-27 (×8): qty 1

## 2018-05-27 MED ORDER — SODIUM CHLORIDE 0.9 % IV SOLN
INTRAVENOUS | Status: DC
  Administered 2018-05-27 – 2018-05-29 (×6): via INTRAVENOUS

## 2018-05-27 MED ORDER — ESCITALOPRAM OXALATE 10 MG PO TABS
10.0000 mg | ORAL_TABLET | Freq: Every day | ORAL | Status: DC
  Filled 2018-05-27 (×2): qty 1

## 2018-05-27 MED ORDER — SODIUM CHLORIDE 0.9% FLUSH: 3.0000 mL | Freq: Once | INTRAVENOUS | Status: DC

## 2018-05-27 MED ORDER — MELATONIN 5 MG PO TABS
1.0000 | ORAL_TABLET | Freq: Every day | ORAL | Status: DC
  Filled 2018-05-27 (×3): qty 1

## 2018-05-27 MED ORDER — ACETAMINOPHEN 325 MG PO TABS
650.0000 mg | ORAL_TABLET | Freq: Four times a day (QID) | ORAL | Status: DC | PRN
  Filled 2018-05-27: qty 2

## 2018-05-27 MED ORDER — SODIUM CHLORIDE 0.9 % IV SOLN
2.0000 g | INTRAVENOUS | Status: DC
  Administered 2018-05-28: 2 g via INTRAVENOUS
  Filled 2018-05-27: qty 20
  Filled 2018-05-27: qty 2
  Filled 2018-05-27: qty 20

## 2018-05-27 MED ORDER — DOXYCYCLINE HYCLATE 100 MG PO TABS
100.0000 mg | ORAL_TABLET | Freq: Two times a day (BID) | ORAL | Status: DC
  Administered 2018-05-28: 100 mg via ORAL
  Filled 2018-05-27 (×2): qty 1

## 2018-05-27 MED ORDER — SODIUM CHLORIDE 0.9 % IV SOLN
1.0000 g | Freq: Once | INTRAVENOUS | Status: AC
  Administered 2018-05-27: 1 g via INTRAVENOUS
  Filled 2018-05-27: qty 10

## 2018-05-27 MED ORDER — OXYCODONE HCL 5 MG PO TABS
5.0000 mg | ORAL_TABLET | ORAL | Status: DC | PRN
  Administered 2018-05-28 (×2): 5 mg via ORAL
  Filled 2018-05-27 (×2): qty 1

## 2018-05-27 MED ORDER — ACETAMINOPHEN 650 MG RE SUPP
650.0000 mg | Freq: Four times a day (QID) | RECTAL | Status: DC | PRN
  Filled 2018-05-27: qty 1

## 2018-05-27 MED ORDER — SODIUM CHLORIDE 0.9 % IV SOLN
1000.0000 mL | Freq: Once | INTRAVENOUS | Status: AC
  Administered 2018-05-27: 1000 mL via INTRAVENOUS

## 2018-05-27 NOTE — ED Triage Notes (Signed)
Pt presents via EMS from [place]. 02 sat per EMS 85% ra, 94% 4L. C/o pain all over per EMS.

## 2018-05-27 NOTE — Progress Notes (Signed)
Patient ID: [facility], male   DOB: 07-28-1950, 68 y.o.   MRN: [id number]  ACP note  Patient, brother who is the POA and nephew at the bedside  Diagnosis stage IV kidney cancer  CODE STATUS discussed.  Currently the patient will be a full code.  Family will talk further.  I explained that with stage IV kidney cancer will be incurable and eventually something will cause his death if it is not the cancer itself.  I can treat pneumonia with antibiotics.  The patient is currently in a lot of pain and I will give him some pain medications.  I will get a CT scan of the abdomen pelvis to further look at the kidney cancer.  CODE STATUS: Full code currently  Time spent on ACP discussion 17 minutes Dr. Loletha Grayer

## 2018-05-27 NOTE — H&P (Signed)
[facility] [place] NAME: Keith Daniels    MR#:  [id number]  DATE OF BIRTH:  January 25, 1951  DATE OF ADMISSION:  06/10/2018  PRIMARY CARE PHYSICIAN: [name], MD   REQUESTING/REFERRING PHYSICIAN: Dr Lavonia Drafts  CHIEF COMPLAINT:   Chief Complaint  Patient presents with  . Altered Mental Status    HISTORY OF PRESENT ILLNESS:  Keith Daniels  is a 68 y.o. male with a known history of traumatic brain injury and bedbound and in long-term care at [place].  He is being treated for stage IV kidney cancer.  Sent in with altered mental status and found to have a pneumonia.  The patient is not the best historian.  He states he feels bad and he hurts everywhere.  He has had some weight loss and decreased appetite.  States he has had a fever.  Patient's brother and nephew at the bedside to help out with history.  PAST MEDICAL HISTORY:   Past Medical History:  Diagnosis Date  . Alcohol dependence in remission ([place])   . Chewing tobacco nicotine dependence, uncomplicated   . Chronic viral hepatitis C ([place])   . Cognitive communication deficit   . Diffuse traumatic brain injury ([place])   . Dysphagia, oropharyngeal phase   . Essential hypertension   . Extended spectrum beta lactamase (ESBL) resistance   . Gross hematuria   . History of traumatic brain injury   . Major depressive disorder   . Metastatic renal cell carcinoma ([place]) 05/11/2018  . Muscle weakness (generalized)   . Unspecified severe protein-calorie malnutrition ([place])   . Urinary tract infection, site not specified     PAST SURGICAL HISTORY:   Past Surgical History:  Procedure Laterality Date  . TESTICLE REMOVAL      SOCIAL HISTORY:   Social History   Tobacco Use  . Smoking status: Former Smoker    Packs/day: 1.00    Years: 10.00    Pack years: 10.00    Types: Cigarettes    Last attempt to quit: 04/03/2017    Years since quitting: 1.1  . Smokeless  tobacco: Never Used  Substance Use Topics  . Alcohol use: Not Currently    Comment: quit in 2019    FAMILY HISTORY:   Family History  Problem Relation Age of Onset  . Kidney disease Mother   . Pneumonia Father   . Hypertension Brother   . Prostate cancer Brother     DRUG ALLERGIES:  No Known Allergies  REVIEW OF SYSTEMS:  CONSTITUTIONAL: Positive for fever.  Positive weight loss. EYES: Positive for blurred vision EARS, NOSE, AND THROAT: Positive for runny nose sore throat and dysphasia to liquids and solids RESPIRATORY: Some shortness of breath.  No cough CARDIOVASCULAR: Positive for chest pain.  GASTROINTESTINAL: Some vomiting.  Some diarrhea.  Positive for abdominal pain. GENITOURINARY: Positive for dysuria ENDOCRINE: No polyuria, nocturia,  HEMATOLOGY: No anemia, easy bruising or bleeding SKIN: No rash or lesion. MUSCULOSKELETAL:  positive for pain everywhere NEUROLOGIC: No tingling, numbness.  PSYCHIATRY: Positive for depression  MEDICATIONS AT HOME:   Prior to Admission medications   Medication Sig Start Date End Date Taking? Authorizing Provider  axitinib (INLYTA) 5 MG tablet Take 1 tablet (5 mg total) by mouth 2 (two) times daily. 05/11/18  Yes Earlie Server, MD  escitalopram (LEXAPRO) 10 MG tablet Take 10 mg by mouth daily.   Yes [provider]  Melatonin 300 MCG TABS Take 1.5 tablets by mouth  at bedtime.  11/30/16  Yes [provider]  MULTIPLE VITAMIN PO Take 1 tablet by mouth daily.   Yes [provider]  ondansetron (ZOFRAN) 8 MG tablet Take 1 tablet (8 mg total) by mouth 2 (two) times daily. 05/11/18  Yes Earlie Server, MD  oseltamivir (TAMIFLU) 30 MG capsule Take 30 mg by mouth daily.   Yes [provider]  thiamine (VITAMIN B-1) 100 MG tablet Take 100 mg by mouth daily.   Yes [provider]  traMADol (ULTRAM) 50 MG tablet Take by mouth every 12 (twelve) hours as needed.   Yes [provider]   Medication  reconciliation still undergoing  VITAL SIGNS:  Blood pressure (!) 161/110, pulse 84, temperature 98.4 F (36.9 C), temperature source Oral, resp. rate (!) 21, SpO2 96 %.  PHYSICAL EXAMINATION:  GENERAL:  68 y.o.-year-old patient lying in the bed with  acute distress.  EYES: Pupils equal, round, reactive to light and accommodation. No scleral icterus. HEENT: Head atraumatic, normocephalic. Oropharynx and nasopharynx clear.  NECK:  Supple, no jugular venous distention. No thyroid enlargement, no tenderness.  LUNGS: Decreased breath sounds bilaterally, no wheezing.  Scattered rhonchi no use of accessory muscles of respiration.  CARDIOVASCULAR: S1, S2 normal. No murmurs, rubs, or gallops.  ABDOMEN: Generalized abdominal tenderness and tense feeling. Bowel sounds present. No organomegaly or mass.  EXTREMITIES: No pedal edema, cyanosis, or clubbing.  NEUROLOGIC: Patient able to move his arms.  Difficult to test because the patient is yelling the entire time PSYCHIATRIC: The patient is alert and yelling in pain hard to focus.[name]  SKIN: No rash, lesion, or ulcer.   LABORATORY PANEL:   CBC Recent Labs  Lab 06/17/2018 1041  WBC 7.0  HGB 10.5*  HCT 33.1*  PLT 172   ------------------------------------------------------------------------------------------------------------------  Chemistries  Recent Labs  Lab 06/25/2018 1041  NA 141  K 5.3*  CL 109  CO2 24  GLUCOSE 135*  BUN 41*  CREATININE 1.72*  CALCIUM 9.1  AST 74*  ALT 81*  ALKPHOS 54  BILITOT 0.9   ------------------------------------------------------------------------------------------------------------------    RADIOLOGY:  Dg Chest Portable 1 View  Result Date: 06/04/2018 CLINICAL DATA:  Hx of metastatic renal cell CA, now with increasing SOB Former smoker 2019 EXAM: PORTABLE CHEST - 1 VIEW COMPARISON:  CT 05/03/2018 FINDINGS: Left retrocardiac consolidation/atelectasis, partially obscuring the left diaphragmatic leaflet.  Right lung clear. The small nodules seen on the prior study are not conspicuous on chest radiography. Heart size within normal limits. Blunting of the left lateral costophrenic angle suggesting possible small effusion. Visualized bones unremarkable. IMPRESSION: Left retrocardiac consolidation/atelectasis with possible small effusion. Electronically Signed   By: Lucrezia Europe M.D.   On: 06/05/2018 11:37     IMPRESSION AND PLAN:   1.  Acute hypoxic respiratory failure with a pulse ox of 86% on room air.  Oxygen supplementation 2.  Pneumonia.  IV Rocephin and Zithromax ordered today.  Will continue IV Rocephin tomorrow and oral doxycycline tomorrow. 3.  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  Patient with abdominal pain.  We will get a CT scan of the abdomen and pelvis for further evaluation.  CODE STATUS discussed and brother wants him to be a full code currently.  He will talk further with other family members.  We will get a palliative care consultation.  Overall prognosis is poor. 4.  History of traumatic brain injury and bedbound 5.  Severe malnutrition 6.  Acute kidney injury on chronic kidney disease.  Gentle IV fluid hydration 7.  Hyperkalemia secondary to acute kidney injury.  Give IV fluids overnight. 8.  Depression continue psychiatric medications   All the records are reviewed and case discussed with ED provider. Management plans discussed with the patient, family and they are in agreement.  CODE STATUS: Full code  TOTAL TIME TAKING CARE OF THIS PATIENT: 50 minutes, including ACP time.    Loletha Grayer M.D on 06/21/2018 at 1:32 PM  Between 7am to 6pm - Pager - [phone]  After 6pm call admission pager [phone]  [facility] Office  [phone]  CC: Primary care physician; [name], MD

## 2018-05-27 NOTE — ED Provider Notes (Signed)
[facility] Emergency Department Provider Note   ____________________________________________    I have reviewed the triage vital signs and the nursing notes.   HISTORY  Chief Complaint Altered Mental Status  Limited by altered mental status   HPI Keith Daniels is a 68 y.o. male with a history of diffuse traumatic brain injury, renal cell carcinoma apparently started recent immunotherapy who presents with altered mental status and hypoxia from facility.  Reportedly the patient was satting in the low 80s this morning and seemed to have increased work of breathing and was confused.  Past Medical History:  Diagnosis Date  . Alcohol dependence in remission ([place])   . Chewing tobacco nicotine dependence, uncomplicated   . Chronic viral hepatitis C ([place])   . Cognitive communication deficit   . Diffuse traumatic brain injury ([place])   . Dysphagia, oropharyngeal phase   . Essential hypertension   . Extended spectrum beta lactamase (ESBL) resistance   . Gross hematuria   . History of traumatic brain injury   . Major depressive disorder   . Metastatic renal cell carcinoma ([place]) 05/11/2018  . Muscle weakness (generalized)   . Unspecified severe protein-calorie malnutrition ([place])   . Urinary tract infection, site not specified     Patient Active Problem List   Diagnosis Date Noted  . Pneumonia 06/26/2018  . Palliative care encounter 05/24/2018  . Weakness generalized 05/24/2018  . Anorexic 05/24/2018  . Normocytic anemia 05/24/2018  . AKI (acute kidney injury) ([place]) 05/23/2018  . Renal cell carcinoma of left kidney ([place]) 05/16/2018  . Metastatic renal cell carcinoma ([place]) 05/11/2018  . Goals of care, counseling/discussion 04/28/2018  . 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 Traumatic brain injury ([place]) 11/25/2016  . Delirium 11/18/2016  . Hypercalcemia 10/26/2016  . Hypernatremia 10/26/2016  . Prolonged QT  interval 10/26/2016  . Hypertension 10/14/2016  . Chronic hepatitis C virus infection ([place]) 11/16/2014  . Tobacco use disorder 02/01/2012    History reviewed. No pertinent surgical history.  Prior to Admission medications   Medication Sig Start Date End Date Taking? Authorizing Provider  axitinib (INLYTA) 5 MG tablet Take 1 tablet (5 mg total) by mouth 2 (two) times daily. 05/11/18  Yes Earlie Server, MD  escitalopram (LEXAPRO) 10 MG tablet Take 10 mg by mouth daily.   Yes [provider]  Melatonin 300 MCG TABS Take 1.5 tablets by mouth at bedtime.  11/30/16  Yes [provider]  MULTIPLE VITAMIN PO Take 1 tablet by mouth daily.   Yes [provider]  ondansetron (ZOFRAN) 8 MG tablet Take 1 tablet (8 mg total) by mouth 2 (two) times daily. 05/11/18  Yes Earlie Server, MD  oseltamivir (TAMIFLU) 30 MG capsule Take 30 mg by mouth daily.   Yes [provider]  thiamine (VITAMIN B-1) 100 MG tablet Take 100 mg by mouth daily.   Yes [provider]  traMADol (ULTRAM) 50 MG tablet Take by mouth every 12 (twelve) hours as needed.   Yes [provider]     Allergies Patient has no known allergies.  Family History  Problem Relation Age of Onset  . Kidney disease Mother   . Hypertension Brother   . Prostate cancer Brother     Social History Social History   Tobacco Use  . Smoking status: Former Smoker    Packs/day: 1.00    Years: 10.00    Pack years: 10.00    Types: Cigarettes  Last attempt to quit: 04/03/2017    Years since quitting: 1.1  . Smokeless tobacco: Never Used  Substance Use Topics  . Alcohol use: Not Currently    Comment: quit in 2019  . Drug use: Not Currently    Types: "Crack" cocaine    Comment: quit in 2019    5 caveat: Unable to obtain review of Systems to altered mental status    ____________________________________________   PHYSICAL EXAM:  VITAL SIGNS: ED Triage Vitals  Enc Vitals Group     BP 05/31/2018 1032  (!) 161/110     Pulse Rate 06/03/2018 1032 90     Resp 06/20/2018 1032 20     Temp 06/10/2018 1032 98.4 F (36.9 C)     Temp Source 06/05/2018 1032 Oral     SpO2 06/20/2018 1032 (!) 86 %     Weight --      Height --      Head Circumference --      Peak Flow --      Pain Score 06/20/2018 1033 10     Pain Loc --      Pain Edu? --      Excl. in [place]? --     Constitutional: Alert but confused Eyes: Conjunctivae are normal.  Head: Atraumatic. Nose: No congestion/rhinnorhea. Mouth/Throat: Mucous membranes are very dry  Cardiovascular: Normal rate, regular rhythm. Grossly normal heart sounds.  Good peripheral circulation. Respiratory: Normal respiratory effort.  No retractions. Lungs CTAB. Gastrointestinal: Soft and nontender. No distention.    Musculoskeletal: Warm and well perfused Neurologic:  Normal speech and language. No gross focal neurologic deficits are appreciated.  Skin:  Skin is warm, dry and intact. No rash noted. Psychiatric: Mood and affect are normal. Speech and behavior are normal.  ____________________________________________   LABS (all labs ordered are listed, but only abnormal results are displayed)  Labs Reviewed  COMPREHENSIVE METABOLIC PANEL - Abnormal; Notable for the following components:      Result Value   Potassium 5.3 (*)    Glucose, Bld 135 (*)    BUN 41 (*)    Creatinine, Ser 1.72 (*)    Albumin 3.0 (*)    AST 74 (*)    ALT 81 (*)    GFR calc non Af Amer 41 (*)    GFR calc Af Amer 47 (*)    All other components within normal limits  CBC - Abnormal; Notable for the following components:   RBC 3.83 (*)    Hemoglobin 10.5 (*)    HCT 33.1 (*)    RDW 15.9 (*)    All other components within normal limits  CULTURE, BLOOD (ROUTINE X 2)  CULTURE, BLOOD (ROUTINE X 2)  TROPONIN I  BRAIN NATRIURETIC PEPTIDE   ____________________________________________  EKG   ____________________________________________  RADIOLOGY  X-ray with left retrocardiac  infiltrate ____________________________________________   PROCEDURES  Procedure(s) performed: No  Procedures   Critical Care performed: yes  CRITICAL CARE Performed by: Lavonia Drafts   Total critical care time: 30 minutes  Critical care time was exclusive of separately billable procedures and treating other patients.  Critical care was necessary to treat or prevent imminent or life-threatening deterioration.  Critical care was time spent personally by me on the following activities: development of treatment plan with patient and/or surrogate as well as nursing, discussions with consultants, evaluation of patient's response to treatment, examination of patient, obtaining history from patient or surrogate, ordering and performing treatments and interventions, ordering and review of  laboratory studies, ordering and review of radiographic studies, pulse oximetry and re-evaluation of patient's condition.  ____________________________________________   INITIAL IMPRESSION / ASSESSMENT AND PLAN / ED COURSE  Pertinent labs & imaging results that were available during my care of the patient were reviewed by me and considered in my medical decision making (see chart for details).  Patient with multiple comorbidities as noted above presents with change in mental status and hypoxia, strongly suspicious for metabolic encephalopathy.  Room air saturations in the 80s in the emergency department, placed on nasal cannula with significant improvement.  Lab work overall reassuring except for significant dehydration will give IV fluids.  Chest x-ray demonstrates retrocardiac infiltrate, discussed with hospitalist treated with Rocephin and azithromycin  ____________________________________________   FINAL CLINICAL IMPRESSION(S) / ED DIAGNOSES  Final diagnoses:  Community acquired pneumonia, unspecified laterality  Metabolic encephalopathy        Note:  This document was prepared using  Dragon voice recognition software and may include unintentional dictation errors.   Lavonia Drafts, MD 06/10/2018 1326

## 2018-05-28 ENCOUNTER — Other Ambulatory Visit: Payer: Self-pay

## 2018-05-28 ENCOUNTER — Inpatient Hospital Stay: Payer: Medicare Other

## 2018-05-28 DIAGNOSIS — Z515 Encounter for palliative care: Secondary | ICD-10-CM

## 2018-05-28 DIAGNOSIS — G9341 Metabolic encephalopathy: Secondary | ICD-10-CM

## 2018-05-28 LAB — BASIC METABOLIC PANEL
Anion gap: 9 (ref 5–15)
BUN: 49 mg/dL — ABNORMAL HIGH (ref 8–23)
CO2: 23 mmol/L (ref 22–32)
Calcium: 8.7 mg/dL — ABNORMAL LOW (ref 8.9–10.3)
Chloride: 113 mmol/L — ABNORMAL HIGH (ref 98–111)
Creatinine, Ser: 1.99 mg/dL — ABNORMAL HIGH (ref 0.61–1.24)
GFR calc Af Amer: 39 mL/min — ABNORMAL LOW (ref >60)
GFR calc non Af Amer: 34 mL/min — ABNORMAL LOW (ref >60)
GLUCOSE: 99 mg/dL (ref 70–99)
Potassium: 5.6 mmol/L — ABNORMAL HIGH (ref 3.5–5.1)
Sodium: 145 mmol/L (ref 135–145)

## 2018-05-28 LAB — COMPREHENSIVE METABOLIC PANEL
ALT: 106 U/L — ABNORMAL HIGH (ref 0–44)
AST: 63 U/L — ABNORMAL HIGH (ref 15–41)
Albumin: 2.8 g/dL — ABNORMAL LOW (ref 3.5–5.0)
Alkaline Phosphatase: 55 U/L (ref 38–126)
Anion gap: 12 (ref 5–15)
BUN: 56 mg/dL — ABNORMAL HIGH (ref 8–23)
CO2: 19 mmol/L — ABNORMAL LOW (ref 22–32)
Calcium: 8.2 mg/dL — ABNORMAL LOW (ref 8.9–10.3)
Chloride: 114 mmol/L — ABNORMAL HIGH (ref 98–111)
Creatinine, Ser: 2.41 mg/dL — ABNORMAL HIGH (ref 0.61–1.24)
GFR calc Af Amer: 31 mL/min — ABNORMAL LOW (ref >60)
GFR calc non Af Amer: 27 mL/min — ABNORMAL LOW (ref >60)
GLUCOSE: 104 mg/dL — AB (ref 70–99)
Potassium: 5.1 mmol/L (ref 3.5–5.1)
Sodium: 145 mmol/L (ref 135–145)
TOTAL PROTEIN: 6.4 g/dL — AB (ref 6.5–8.1)
Total Bilirubin: 0.7 mg/dL (ref 0.3–1.2)

## 2018-05-28 LAB — CBC
HCT: 33 % — ABNORMAL LOW (ref 39.0–52.0)
HCT: 33.7 % — ABNORMAL LOW (ref 39.0–52.0)
Hemoglobin: 10.3 g/dL — ABNORMAL LOW (ref 13.0–17.0)
Hemoglobin: 10.3 g/dL — ABNORMAL LOW (ref 13.0–17.0)
MCH: 27.5 pg (ref 26.0–34.0)
MCH: 27.2 pg (ref 26.0–34.0)
MCHC: 31.2 g/dL (ref 30.0–36.0)
MCHC: 30.6 g/dL (ref 30.0–36.0)
MCV: 88 fL (ref 80.0–100.0)
MCV: 89.2 fL (ref 80.0–100.0)
Platelets: 179 10*3/uL (ref 150–400)
Platelets: 207 10*3/uL (ref 150–400)
RBC: 3.75 MIL/uL — ABNORMAL LOW (ref 4.22–5.81)
RBC: 3.78 MIL/uL — ABNORMAL LOW (ref 4.22–5.81)
RDW: 16.2 % — ABNORMAL HIGH (ref 11.5–15.5)
RDW: 16.4 % — ABNORMAL HIGH (ref 11.5–15.5)
WBC: 6.4 10*3/uL (ref 4.0–10.5)
WBC: 7.6 10*3/uL (ref 4.0–10.5)
nRBC: 1.1 % — ABNORMAL HIGH (ref 0.0–0.2)
nRBC: 2.8 % — ABNORMAL HIGH (ref 0.0–0.2)

## 2018-05-28 LAB — URINALYSIS, COMPLETE (UACMP) WITH MICROSCOPIC
Bacteria, UA: NONE SEEN
Bilirubin Urine: NEGATIVE
Glucose, UA: NEGATIVE mg/dL
Ketones, ur: NEGATIVE mg/dL
Nitrite: NEGATIVE
Protein, ur: 100 mg/dL — AB
RBC / HPF: >50 RBC/hpf — ABNORMAL HIGH (ref 0–5)
Specific Gravity, Urine: 1.02 (ref 1.005–1.030)
WBC, UA: >50 WBC/hpf — ABNORMAL HIGH (ref 0–5)
pH: 5 (ref 5.0–8.0)

## 2018-05-28 LAB — MRSA PCR SCREENING: MRSA by PCR: NEGATIVE

## 2018-05-28 LAB — GLUCOSE, CAPILLARY: Glucose-Capillary: 83 mg/dL (ref 70–99)

## 2018-05-28 LAB — TROPONIN I: Troponin I: 0.28 ng/mL (ref <0.03)

## 2018-05-28 MED ORDER — ORAL CARE MOUTH RINSE
15.0000 mL | Freq: Two times a day (BID) | OROMUCOSAL | Status: DC
  Administered 2018-05-28: 15 mL via OROMUCOSAL

## 2018-05-28 MED ORDER — CHLORHEXIDINE GLUCONATE 0.12 % MT SOLN
15.0000 mL | Freq: Two times a day (BID) | OROMUCOSAL | Status: DC
  Administered 2018-05-28 – 2018-05-29 (×3): 15 mL via OROMUCOSAL
  Filled 2018-05-28 (×4): qty 15

## 2018-05-28 NOTE — Progress Notes (Signed)
   05/28/18 1700  Clinical Encounter Type  Visited With Patient not available;Health care provider  Visit Type Initial;Other (Comment)  Referral From Nurse  Consult/Referral To Chaplain  Spiritual Encounters  Spiritual Needs Emotional  Chaplain received page for RR. Chaplain arrived and team was working on patient. There was no family visiting so Chaplain made her pastoral presence known to staff. Chaplain asked was there anything she could do and staff stated no at this time. Chaplain said page if there is a need.

## 2018-05-28 NOTE — Progress Notes (Signed)
[facility] [place] NAME: Keith Daniels    MR#:  [id number]  DATE OF BIRTH:  Aug 25, 1950  SUBJECTIVE:  CHIEF COMPLAINT:   Chief Complaint  Patient presents with  . Altered Mental Status   Came with altered mental status.  Has been moaning with pain today.  Slightly confused.  Failed swallow evaluation.  CT scan showed worsening spread of his cancer. REVIEW OF SYSTEMS:   CONSTITUTIONAL: Positive for fever.  Positive weight loss. EYES: Positive for blurred vision EARS, NOSE, AND THROAT: Positive for runny nose sore throat and dysphasia to liquids and solids RESPIRATORY: Some shortness of breath.  No cough CARDIOVASCULAR: Positive for chest pain.  GASTROINTESTINAL: Some vomiting.  Some diarrhea.  Positive for abdominal pain. GENITOURINARY: Positive for dysuria ENDOCRINE: No polyuria, nocturia,  HEMATOLOGY: No anemia, easy bruising or bleeding SKIN: No rash or lesion. MUSCULOSKELETAL:  positive for pain everywhere NEUROLOGIC: No tingling, numbness.  PSYCHIATRY: Positive for depression.  ROS  DRUG ALLERGIES:  No Known Allergies  VITALS:  Blood pressure 137/81, pulse 87, temperature 97.8 F (36.6 C), temperature source Oral, resp. rate 19, SpO2 (!) 87 %.  PHYSICAL EXAMINATION:   GENERAL:  68 y.o.-year-old patient lying in the bed with  acute distress.  Moaning constantly. EYES: Pupils equal, round, reactive to light and accommodation. No scleral icterus. HEENT: Head atraumatic, normocephalic. Oropharynx and nasopharynx clear.  Very bad oral hygiene and diet. NECK:  Supple, no jugular venous distention. No thyroid enlargement, no tenderness.  LUNGS: Decreased breath sounds bilaterally, no wheezing.  Scattered rhonchi no use of accessory muscles of respiration.  CARDIOVASCULAR: S1, S2 normal. No murmurs, rubs, or gallops.  ABDOMEN: Generalized abdominal tenderness and tense feeling. Bowel sounds present. No organomegaly or mass.   EXTREMITIES: No pedal edema, cyanosis, or clubbing.  NEUROLOGIC: Patient able to move his arms.  Difficult to test because the patient is yelling the entire time, he does not seem to be moving his lower limbs. PSYCHIATRIC: The patient is alert and yelling in pain hard to focus.[name]  SKIN: No rash, lesion, or ulcer.  Physical Exam LABORATORY PANEL:   CBC Recent Labs  Lab 05/28/18 0751  WBC 6.4  HGB 10.3*  HCT 33.0*  PLT 179   ------------------------------------------------------------------------------------------------------------------  Chemistries  Recent Labs  Lab 06/13/2018 1041  05/28/18 0751  NA 141   < > 145  K 5.3*   < > 5.6*  CL 109   < > 113*  CO2 24   < > 23  GLUCOSE 135*   < > 99  BUN 41*   < > 49*  CREATININE 1.72*   < > 1.99*  CALCIUM 9.1   < > 8.7*  AST 74*  --   --   ALT 81*  --   --   ALKPHOS 54  --   --   BILITOT 0.9  --   --    < > = values in this interval not displayed.   ------------------------------------------------------------------------------------------------------------------  Cardiac Enzymes Recent Labs  Lab 06/03/2018 1041  TROPONINI 0.59*   ------------------------------------------------------------------------------------------------------------------  RADIOLOGY:  Ct Abdomen Pelvis Wo Contrast  Result Date: 06/05/2018 CLINICAL DATA:  Diffuse abdominal pain. Metastatic renal cell carcinoma. EXAM: CT ABDOMEN AND PELVIS WITHOUT CONTRAST TECHNIQUE: Multidetector CT imaging of the abdomen and pelvis was performed following the standard protocol without IV contrast. COMPARISON:  03/09/2018 FINDINGS: Lower chest: Increased cardiomegaly. New small right pleural effusion and mild dependent atelectasis. New infiltrate or atelectasis  in the posterior left lower lobe. Hepatobiliary: No mass visualized on this unenhanced exam. Gallbladder is unremarkable. Pancreas: No mass or inflammatory process visualized on this unenhanced exam. Spleen:  Within  normal limits in size. Adrenals/Urinary tract: Large soft tissue mass is again seen in the left renal hilum and collecting system which measures 5.8 x 5.1 cm. This is increased in size from 4.7 x 3.9 cm on prior study. Stomach/Bowel: No evidence of obstruction, inflammatory process, or abnormal fluid collections. Diverticulosis is seen mainly involving the sigmoid colon, however there is no evidence of diverticulitis. Vascular/Lymphatic: Increased size of soft tissue mass or lymphadenopathy is seen in the left paraaortic region and involving the left psoas muscle. This is also increased in size currently measuring 8.1 x 5.7 cm compared to 5.4 x 3.8 cm previously. Increased size of aortic oval lymph node is also seen which currently measures 1.6 cm compared to 0.7 cm previously. No pelvic lymphadenopathy identified. No evidence of abdominal aortic aneurysm. Reproductive:  No mass or other significant abnormality. Other: New mild ascites and diffuse mesenteric edema compared to prior exam. Musculoskeletal:  No suspicious bone lesions identified. IMPRESSION: 1. Increased size of large soft tissue mass involving the left renal hilum and collecting system. 2. Increased size of retroperitoneal lymphadenopathy, consistent with metastatic disease. 3. New mild ascites and diffuse mesenteric edema. 4. New small right pleural effusion and left lower lobe infiltrate versus atelectasis. 5. Colonic diverticulosis. No radiographic evidence of diverticulitis. Electronically Signed   By: Earle Gell M.D.   On: 05/31/2018 15:25   Dg Chest Portable 1 View  Result Date: 06/10/2018 CLINICAL DATA:  Hx of metastatic renal cell CA, now with increasing SOB Former smoker 2019 EXAM: PORTABLE CHEST - 1 VIEW COMPARISON:  CT 05/03/2018 FINDINGS: Left retrocardiac consolidation/atelectasis, partially obscuring the left diaphragmatic leaflet. Right lung clear. The small nodules seen on the prior study are not conspicuous on chest radiography.  Heart size within normal limits. Blunting of the left lateral costophrenic angle suggesting possible small effusion. Visualized bones unremarkable. IMPRESSION: Left retrocardiac consolidation/atelectasis with possible small effusion. Electronically Signed   By: Lucrezia Europe M.D.   On: 06/03/2018 11:37    ASSESSMENT AND PLAN:   Active Problems:   Pneumonia  1.  Acute hypoxic respiratory failure with a pulse ox of 86% on room air.  Oxygen supplementation 2.  Pneumonia.  IV Rocephin and Zithromax .    3.  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  Patient with abdominal pain.      His cancer load is worse on CT scan 4.  History of traumatic brain injury and bedbound 5.  Severe malnutrition 6.  Acute kidney injury on chronic kidney disease.  Gentle IV fluid hydration 7.  Hyperkalemia secondary to acute kidney injury.  Give IV fluids overnight. 8.  Depression continue psychiatric medications 9.  Dysphagia-failed swallow evaluation.  I had discussion with patient's brother and nephew today on his CT scan findings and his overall very poor prognosis and him being suffering with significant pain and being high risk of recurrent aspirations due to dysphagia.  They had also asked me about possibility of feeding tube but I informed that that would not help much and overall recovery.  They would like to meet with palliative care to make some decisions.  All the records are reviewed and case discussed with Care Management/Social Workerr. Management plans discussed with the patient, family and they are in agreement.  CODE STATUS: Full.  TOTAL  TIME TAKING CARE OF THIS PATIENT: 45 minutes.     POSSIBLE D/C IN 1-2 DAYS, DEPENDING ON CLINICAL CONDITION.   Vaughan Basta M.D on 05/28/2018   Between 7am to 6pm - Pager - [phone]  After 6pm go to [email or web address] - password [email or web address] [facility]  Office  [phone]  CC: Primary care physician; [name],  MD  Note: This dictation was prepared with Dragon dictation along with smaller phrase technology. Any transcriptional errors that result from this process are unintentional.

## 2018-05-28 NOTE — Progress Notes (Addendum)
I was called in rapid response at 5:25.  Reason, Hypotension, 12 beats V tech follows by A fib RVR< mental status change  I have seen pt, he is alert, eyes open, not responding, weakness on both Upper limbs now- more compared to morning- approx 1-2/5. Pupils sluggish On NRBM, use of accesory muscle of breath, no wheeze. Hard to appreciate crepitations. Legs weak- 0/5 since morning.  Blood sugar 82, BP was 60/40, now after trendelenburg position- 100/60, HR slowed down to 110. SPO2 95 on NRBM  Last pain med was in morning. Pt is nonverbal.  Assessment and plan.  * A fib with RVR- Hypotension   Altered mental status    This could be due to sepsis, renal failure or could be a cardiac event Vs a stroke    BP stabilizing now.  Will check for EKG, Troponin, echo Xray chest and CT head Stat labs CMP, CBC, Troponin  Spoke to Brother on phone again. He confirms DNR.  I have explained about this event and possibilities. I have asked and he will like to know what happened in this event- so we might do the work up to get better idea of the situation- which will help him to make decisions about further care.  Meanwhile as now BP and HR stabilizing- will finish 250 ml bolus then cont 100 ml/hr NS and leave on the med surg floor and try to taper oxygen. No need to transfer to ICU for now as it will not change in management.  Discussed this with the floor and rapid response nurse.  Additional time spent 40 critical care min,.

## 2018-05-28 NOTE — Evaluation (Signed)
Clinical/Bedside Swallow Evaluation Patient Details  Name: Randee Upchurch MRN: [id number] Date of Birth: [birth year]  Today's Date: 05/28/2018 Time: SLP Start Time (ACUTE ONLY): 1045 SLP Stop Time (ACUTE ONLY): 1130 SLP Time Calculation (min) (ACUTE ONLY): 45 min  Past Medical History:  Past Medical History:  Diagnosis Date  . Alcohol dependence in remission ([place])   . Chewing tobacco nicotine dependence, uncomplicated   . Chronic viral hepatitis C ([place])   . Cognitive communication deficit   . 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 Dysphagia, oropharyngeal phase   . Essential hypertension   . Extended spectrum beta lactamase (ESBL) resistance   . Gross hematuria   . History of traumatic brain injury   . Major depressive disorder   . Metastatic renal cell carcinoma ([place]) 05/11/2018  . Muscle weakness (generalized)   . Unspecified severe protein-calorie malnutrition ([place])   . Urinary tract infection, site not specified    Past Surgical History:  Past Surgical History:  Procedure Laterality Date  . TESTICLE REMOVAL     HPI:  Pt is a 68 y.o. male with a known history of traumatic brain injury and bedbound and in long-term care at [facility].  He is being treated for stage IV kidney cancer.  Sent in with altered mental status and found to have a pneumonia.  The patient is not the best historian.  He states he feels bad and he hurts everywhere.  He has had some weight loss and decreased appetite.  Per chart at baseline, pt has h/o Cognitive-communication deficits and Dysphagia. Current Cancer w/ metastasis per chart.    Assessment / Plan / Recommendation Clinical Impression  Pt was poorly receptive to participation in a BSE, or oral care. Noted coated oral cavity w/ dried, sticky residue/phlegm on tongue, teeth, lips. When SLP attempted to perform oral care to clean mouth, he moaned loudly at SLP and drew back yelling "NO" w/ eyes wide opened. He exhibited significantly decreased  Cognitive/Mental status for full participateion in an assessment; remained leaning to his Left side moaning loudly throughout the session. This was brief attempt at a BSE w/ a nominal amount taken orally - 1 full bolus TSP amount of Nectar consistency liquids. Pt allowed it to lay orally w/ reduced lingual movement noted; reduced oral awareness for the bolus. No pharyngeal swallow was appreciated in the ~2 mins sitting w/ him and giving him verbal cues. Pt was easily agitated w/ continued engagement. NSG reported attempts at oral care this morning w/ similar ressponse of pt not wanting it done.  Due to pt's oropharyngeal phase dysphagia (h/o as well per chart) and significantly reduced oral awareness and Cognitive status in general, pt is at high risk for aspiration from Dysphagia. Recommend continued NPO status w/ attempts at oral care when pt allows. Oral care will aid hygiene and stimulation of swallowing. ST services will continue to f/u w/ pt's status next 1-2 days for appropriateness for oral intake. MD consulted and updated; NSG updated. Pt does have a Palliative Care consult for [place] per chart orders.  SLP Visit Diagnosis: Dysphagia, oropharyngeal phase (R13.12)(Cognitive decline)    Aspiration Risk  Severe aspiration risk;Risk for inadequate nutrition/hydration    Diet Recommendation  NPO status w/ frequent oral care as best able if pt allows (for hygiene and oral stimulation of swallowing); aspiration precautions  Medication Administration: Via alternative means    Other  Recommendations Recommended Consults: (Palliative Care; dietician) Oral Care Recommendations: Oral care QID;Staff/trained caregiver to provide oral  care Other Recommendations: (TBD)   Follow up Recommendations (TBD)      Frequency and Duration min 3x week  2 weeks       Prognosis Prognosis for Safe Diet Advancement: Guarded Barriers to Reach Goals: Cognitive deficits;Time post onset;Severity of  deficits;Behavior Barriers/Prognosis Comment: impact from Cancer      Swallow Study   General Date of Onset: 06/10/2018 HPI: Pt is a 68 y.o. male with a known history of traumatic brain injury and bedbound and in long-term care at [facility].  He is being treated for stage IV kidney cancer.  Sent in with altered mental status and found to have a pneumonia.  The patient is not the best historian.  He states he feels bad and he hurts everywhere.  He has had some weight loss and decreased appetite.  Per chart at baseline, pt has h/o Cognitive-communication deficits and Dysphagia. Current Cancer w/ metastasis per chart.  Type of Study: Bedside Swallow Evaluation Previous Swallow Assessment: unknown Diet Prior to this Study: NPO Temperature Spikes Noted: No(wbc 6.4) Respiratory Status: Nasal cannula(2-3 liters) History of Recent Intubation: No Behavior/Cognition: Agitated;Uncooperative;Doesn't follow directions;Requires cueing(Moaning loudly; eyes opened) Oral Cavity Assessment: Dried secretions;Excessive secretions;Dry(Coated tongue; sticky) Oral Care Completed by SLP: Yes(attempted but pt yelled and drew back) Oral Cavity - Dentition: Adequate natural dentition(appeared; coated and sticky) Vision: (n/a) Self-Feeding Abilities: Total assist Patient Positioning: Postural control adequate for testing(leaned to his Left onto the bedrail) Baseline Vocal Quality: (Moaning; mumbled speech) Volitional Cough: Cognitively unable to elicit Volitional Swallow: Unable to elicit    Oral/Motor/Sensory Function Overall Oral Motor/Sensory Function: (unable to assess)   Ice Chips Ice chips: Not tested   Thin Liquid Thin Liquid: Not tested    Nectar Thick Nectar Thick Liquid: Impaired Presentation: Spoon(full tsp x1 trial) Oral Phase Impairments: Poor awareness of bolus;Reduced lingual movement/coordination Oral phase functional implications: Oral holding;Oral residue Pharyngeal Phase Impairments:  (no pharyngeal swallow appreciated) Other Comments: refused straw at lips   Honey Thick Honey Thick Liquid: Not tested   Puree Puree: Not tested   Solid     Solid: Not tested       Orinda Kenner, MS, CCC-SLP Alante Tolan 05/28/2018,12:40 PM

## 2018-05-28 NOTE — Progress Notes (Signed)
Initial Nutrition Assessment  DOCUMENTATION CODES:   Not applicable (will re-attempt NPFE at follow-up to assess for malnutrition)  INTERVENTION:   - Recommend obtaining admission weight  - Monitor for diet advancement and supplement as appropriate  - Monitor for [place] discussion, palliative care team consult pending  NUTRITION DIAGNOSIS:   Inadequate oral intake related to inability to eat as evidenced by NPO status.  GOAL:   Patient will meet greater than or equal to 90% of their needs  MONITOR:   Diet advancement, Labs, Weight trends, Other (GOC)  REASON FOR ASSESSMENT:   Malnutrition Screening Tool    ASSESSMENT:   68 year old male who presented to the ED on 3/1 from [facility] with AMS. 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 Pt admitted with PNA.  Attempted to speak with pt at bedside. As soon as RD opened pt's door, pt began groaning loudly and yelling "no!" RD attempted several times to elicit information from pt but was unsuccessful. No family present at bedside.  Spoke with SLP outside of pt's room. SLP to do BSE. Per SLP note, pt to be NPO given high risk of aspiration d/t dysphagia.  Suspect pt with malnutrition but unable to confirm at this time without completion of NFPE which pt refused on multiple attempts. Per weight history in chart, pt's weight has fluctuated between 50.8-64.1 kg over the last 1 month. Unsure of accuracy of weight of 50.8 kg as it is an outlier. No weight this admission. Most recent weight is from 05/24/18.  Palliative consult pending regarding GOC. Will monitor for GOC discussion and diet advancement and provide nutrition interventions as warranted.  Medications reviewed and include: MVI with minerals daily, thiamine 100 mg daily, IV abx IVF: NS @ 50 ml/hr  Labs reviewed: potassium 5.6 (H), BUN 49 (H), creatinine 1.99 (H), elevated LFTs  NUTRITION - FOCUSED PHYSICAL EXAM:  Pt  refused for RD to complete NFPE, yelling "no!" when RD attempted to try on several occasions. Suspect some degree of malnutrition present related to TBI and kidney cancer but unable to confirm at this time.  Diet Order:   Diet Order            Diet NPO time specified Except for: Ice Chips, Sips with Meds  Diet effective now              EDUCATION NEEDS:   Not appropriate for education at this time  Skin:  Skin Assessment: Reviewed RN Assessment  Last BM:  PTA/unknown  Height:   Ht Readings from Last 1 Encounters:  05/23/18 6' (1.829 m)    Weight:   Wt Readings from Last 1 Encounters:  05/24/18 62.8 kg    Ideal Body Weight:  80.9 kg (calculated using height from 2/26)  BMI:  18.77 kg/m^2 (calculated using height from 2/26 and weight from 2/27)  Estimated Nutritional Needs:   Kcal:  1800-2000  Protein:  85-100 grams  Fluid:  >/= 1.8 L    Gaynell Face, MS, RD, LDN Inpatient Clinical Dietitian Pager: [phone] Weekend/After Hours: [phone]

## 2018-05-28 NOTE — Progress Notes (Signed)
This RN spoke  With Keith Daniels POA about setting up a password to help protect patient rights. Mr. Judge Duque stated he would have [facility] fax over [place] paperwork. Terrial Rhodes

## 2018-05-28 NOTE — Clinical Social Work Note (Signed)
CSW has noted patient is a long term care resident at [facility] and can return upon discharge according to [place] at [place]. CSW has left a message for patient's son: Remy Voiles: [phone] in regards to completing an assessment. Shela Leff MSW,LCSW [phone]

## 2018-05-28 NOTE — NC FL2 (Signed)
[place] LEVEL OF CARE SCREENING TOOL     IDENTIFICATION  Patient Name: Keith Daniels Birthdate: 1950-09-16 Sex: male Admission Date (Current Location): 06/16/2018  [place] and [place] Number:  Engineering geologist and Address:  [facility], [address], [place], [place] [address]      Provider Number: [id number]  Attending Physician Name and Address:  Vaughan Basta, *  Relative Name and Phone Number:       Current Level of Care: Hospital Recommended Level of Care: [place] Prior Approval Number:    Date Approved/Denied:   PASRR Number:    Discharge Plan: SNF    Current Diagnoses: Patient Active Problem List   Diagnosis Date Noted  . Pneumonia 06/22/2018  . Palliative care encounter 05/24/2018  . Weakness generalized 05/24/2018  . Anorexic 05/24/2018  . Normocytic anemia 05/24/2018  . AKI (acute kidney injury) ([place]) 05/23/2018  . Renal cell carcinoma of left kidney ([place]) 05/16/2018  . Metastatic renal cell carcinoma ([place]) 05/11/2018  . Goals of care, counseling/discussion 04/28/2018  . 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 Traumatic brain injury ([place]) 11/25/2016  . Delirium 11/18/2016  . Hypercalcemia 10/26/2016  . Hypernatremia 10/26/2016  . Prolonged QT interval 10/26/2016  . Hypertension 10/14/2016  . Chronic hepatitis C virus infection ([place]) 11/16/2014  . Tobacco use disorder 02/01/2012    Orientation RESPIRATION BLADDER Height & Weight     Self  Normal Incontinent Weight:   Height:     BEHAVIORAL SYMPTOMS/MOOD NEUROLOGICAL BOWEL NUTRITION STATUS  (none) (none) Continent Diet  AMBULATORY STATUS COMMUNICATION OF NEEDS Skin   Total Care Verbally Normal                       Personal Care Assistance Level of Assistance  Total care       Total Care Assistance: Maximum assistance   Functional Limitations Info  (none)           SPECIAL CARE FACTORS FREQUENCY                       Contractures Contractures Info: Not present    Additional Factors Info  Code Status Code Status Info: full             Current Medications (05/28/2018):  This is the current hospital active medication list Current Facility-Administered Medications  Medication Dose Route Frequency Provider Last Rate Last Dose  . 0.9 %  sodium chloride infusion   Intravenous Continuous Vaughan Basta, MD 50 mL/hr at 05/28/18 0400    . acetaminophen (TYLENOL) tablet 650 mg  650 mg Oral Q6H PRN Loletha Grayer, MD       Or  . acetaminophen (TYLENOL) suppository 650 mg  650 mg Rectal Q6H PRN Wieting, Richard, MD      . cefTRIAXone (ROCEPHIN) 2 g in sodium chloride 0.9 % 100 mL IVPB  2 g Intravenous Q24H Wieting, Richard, MD      . doxycycline (VIBRA-TABS) tablet 100 mg  100 mg Oral Q12H Wieting, Richard, MD      . escitalopram (LEXAPRO) tablet 10 mg  10 mg Oral Daily Wieting, Richard, MD      . heparin injection 5,000 Units  5,000 Units Subcutaneous Q8H Loletha Grayer, MD   5,000 Units at 06/14/2018 2243  . Melatonin TABS 5 mg  1 tablet Oral QHS Loletha Grayer, MD      . morphine 2  MG/ML injection 2 mg  2 mg Intravenous Q4H PRN Loletha Grayer, MD   2 mg at 05/28/18 0618  . multivitamin with minerals tablet 1 tablet  1 tablet Oral Daily Wieting, Richard, MD      . oxyCODONE (Oxy IR/ROXICODONE) immediate release tablet 5 mg  5 mg Oral Q4H PRN Loletha Grayer, MD   5 mg at 05/28/18 1045  . sodium chloride flush (NS) 0.9 % injection 3 mL  3 mL Intravenous Once Wieting, Richard, MD      . thiamine (VITAMIN B-1) tablet 100 mg  100 mg Oral Daily Loletha Grayer, MD         Discharge Medications: Please see discharge summary for a list of discharge medications.  Relevant Imaging Results:  Relevant Lab Results:   Additional Information    Shela Leff, LCSW

## 2018-05-28 NOTE — Progress Notes (Signed)
Central tele called and reported patient  Had an 11 beat run of SVT Dr. Anselm Jungling was notified. Patient very lethargic upon entering the room. bllod pressure in the 83'D systolically. Rapid Response called. Keith Daniels

## 2018-05-28 NOTE — Significant Event (Signed)
Rapid Response Event Note  Overview: Time Called: 5366 Arrival Time: 1726 Event Type: Hypotension, Respiratory, Neurologic  Initial Focused Assessment: Rapid response RN arrived in patient's room with patient lying in bed with blood pressure cuff reading 69/53 and patient hard to arouse. Per patient's RN Jonelle Sidle, patient has a history of traumatic brain injury and kidney cancer with mets to his lungs. He was lethargic with unequal pupils (uncertain of baseline pupils), unable to follow commands initially but would arouse to pain and repeated stimulation. Patient placed in trendelenberg position while history obtained from [facility] and 250 mL bolus started. Vitals in trendelenberg: HR 96 irregular (sinus arrhthymia vs afib), BP 93/69, MAP 75, O2 saturation 80 on 6L nasal cannula, RR 20. Lungs diminished, NRB placed. While bolus infusing, patient slightly more alert and transition to flat (rather than trendelenberg): BP 112/77, MAP 87, oxygen saturation 96% on NRB. When bolus finished patient transitioned to sitting up: BP 105/73, MAP 84. Tried to place patient on 6L nasal cannula but oxygen saturations dropped to 70s, so placed back on NRB and saturations improved to upper 90s.  Interventions: 250 mL NS bolus. Per Dr. Anselm Jungling, CT of the head, chest x-ray, labs, and 12 lead EKG. Dr. Anselm Jungling updated family who agreed to maintain DNR but wanted tests to see if there were answers to why patient declined. Patient became more arousable as event progressed until post CT when he was at pre event baseline. Patient transitioned to HFNC.  Last vital signs before left taken at 1817: HR 91 irregular, 16 RR, oxygen saturations 95% on 15 L HFNC.   Plan of Care (if not transferred): Patient to stay on 2C for now per Dr. Anselm Jungling even if on NRB since code status clarified. Jonelle Sidle RN instructed to pass on to recall rapid response if needed due to patient deteriorates and family deciding to pursue further  treatment.  Event Summary: Name of Physician Notified: Dr. Anselm Jungling at 1724    at    Outcome: Stayed in room and stabalized, Code status clarified  Event End Time: [place], [place]

## 2018-05-28 NOTE — Consult Note (Signed)
[place]  Telephone:([phone] [phone]   Name: Keith Daniels Date: 05/28/2018 MRN: [id number]  DOB: 1950-08-31  Patient Care Team: [name], MD as PCP - General (Family Medicine)    REASON FOR CONSULTATION: Palliative Care consult requested for this 68 y.o. 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 PMH also notable for history of HCV, HTN, Alcohol abuse, and IVDU. Patient is a resident at [facility] due to previous TBI. He is wheelchair bound at baseline.  Patient was admitted to the hospital on 06/01/2018 with altered mental status and acute hypoxic respiratory failure.  Chest x-ray revealed left-sided consolidation with probable pneumonia.  Abdominal CT revealed disease progression with increased size of the large soft tissue mass in the left renal hilum and retroperitoneal lymphadenopathy.  Palliative care was consulted to help provide supportive care.   SOCIAL HISTORY:    Patient is a resident at [facility]. He has a brother and sister-in-law who are involved but live in [place], [place]. Patient has another sister who is also in poor health. Patient is not married and does not have children.  ADVANCE DIRECTIVES:  HCPOA documents on file. MOST completed on 05/11/18  CODE STATUS: DNR  PAST MEDICAL HISTORY: Past Medical History:  Diagnosis Date  . Alcohol dependence in remission ([place])   . Chewing tobacco nicotine dependence, uncomplicated   . Chronic viral hepatitis C ([place])   . Cognitive communication deficit   . Diffuse traumatic brain injury ([place])   . Dysphagia, oropharyngeal phase   . Essential hypertension   . Extended spectrum beta lactamase (ESBL) resistance   . Gross hematuria   . History of traumatic brain injury   . Major depressive disorder   . Metastatic renal cell carcinoma ([place]) 05/11/2018  . Muscle weakness (generalized)   .  Unspecified severe protein-calorie malnutrition ([place])   . Urinary tract infection, site not specified     PAST SURGICAL HISTORY:  Past Surgical History:  Procedure Laterality Date  . TESTICLE REMOVAL      HEMATOLOGY/ONCOLOGY HISTORY:    Metastatic renal cell carcinoma ([place])   05/11/2018 Initial Diagnosis    Metastatic renal cell carcinoma ([place])    05/16/2018 -  Chemotherapy    The patient had pembrolizumab (KEYTRUDA) 200 mg in sodium chloride 0.9 % 50 mL chemo infusion, 200 mg, Intravenous, Once, 1 of 6 cycles Administration: 200 mg (05/16/2018)  for chemotherapy treatment.      ALLERGIES:  has No Known Allergies.  MEDICATIONS:  Current Facility-Administered Medications  Medication Dose Route Frequency Provider Last Rate Last Dose  . 0.9 %  sodium chloride infusion   Intravenous Continuous Vaughan Basta, MD 100 mL/hr at 05/28/18 1639    . acetaminophen (TYLENOL) tablet 650 mg  650 mg Oral Q6H PRN Loletha Grayer, MD       Or  . acetaminophen (TYLENOL) suppository 650 mg  650 mg Rectal Q6H PRN Wieting, Richard, MD      . cefTRIAXone (ROCEPHIN) 2 g in sodium chloride 0.9 % 100 mL IVPB  2 g Intravenous Q24H Loletha Grayer, MD 200 mL/hr at 05/28/18 1231 2 g at 05/28/18 1231  . chlorhexidine (PERIDEX) 0.12 % solution 15 mL  15 mL Mouth Rinse BID Vaughan Basta, MD   15 mL at 05/28/18 1636  . doxycycline (VIBRA-TABS) tablet 100 mg  100 mg Oral Q12H Wieting, Richard, MD      . escitalopram (LEXAPRO) tablet 10  mg  10 mg Oral Daily Wieting, Richard, MD      . heparin injection 5,000 Units  5,000 Units Subcutaneous Q8H Loletha Grayer, MD   5,000 Units at 05/28/18 1634  . MEDLINE mouth rinse  15 mL Mouth Rinse q12n4p Vaughan Basta, MD   15 mL at 05/28/18 1641  . Melatonin TABS 5 mg  1 tablet Oral QHS Wieting, Richard, MD      . morphine 2 MG/ML injection 2 mg  2 mg Intravenous Q4H PRN Loletha Grayer, MD   2 mg at 05/28/18 1248  . multivitamin with minerals  tablet 1 tablet  1 tablet Oral Daily Wieting, Richard, MD      . oxyCODONE (Oxy IR/ROXICODONE) immediate release tablet 5 mg  5 mg Oral Q4H PRN Loletha Grayer, MD   5 mg at 05/28/18 1045  . sodium chloride flush (NS) 0.9 % injection 3 mL  3 mL Intravenous Once Wieting, Richard, MD      . thiamine (VITAMIN B-1) tablet 100 mg  100 mg Oral Daily Wieting, Richard, MD        VITAL SIGNS: BP 137/81 (BP Location: Left Arm)   Pulse 87   Temp 97.8 F (36.6 C) (Oral)   Resp 19   SpO2 (!) 87%  There were no vitals filed for this visit.  Estimated body mass index is 18.77 kg/m as calculated from the following:   Height as of 05/23/18: 6' (1.829 m).   Weight as of 05/24/18: 138 lb 6.4 oz (62.8 kg).  LABS: CBC:    Component Value Date/Time   WBC 6.4 05/28/2018 0751   HGB 10.3 (L) 05/28/2018 0751   HCT 33.0 (L) 05/28/2018 0751   PLT 179 05/28/2018 0751   MCV 88.0 05/28/2018 0751   NEUTROABS 1.9 05/23/2018 1345   LYMPHSABS 0.7 05/23/2018 1345   MONOABS 0.3 05/23/2018 1345   EOSABS 0.1 05/23/2018 1345   BASOSABS 0.0 05/23/2018 1345   Comprehensive Metabolic Panel:    Component Value Date/Time   NA 145 05/28/2018 0751   K 5.6 (H) 05/28/2018 0751   CL 113 (H) 05/28/2018 0751   CO2 23 05/28/2018 0751   BUN 49 (H) 05/28/2018 0751   CREATININE 1.99 (H) 05/28/2018 0751   GLUCOSE 99 05/28/2018 0751   CALCIUM 8.7 (L) 05/28/2018 0751   AST 74 (H) 05/31/2018 1041   ALT 81 (H) 06/05/2018 1041   ALKPHOS 54 06/13/2018 1041   BILITOT 0.9 06/26/2018 1041   PROT 7.1 06/21/2018 1041   ALBUMIN 3.0 (L) 06/13/2018 1041    RADIOGRAPHIC STUDIES: Ct Abdomen Pelvis Wo Contrast  Result Date: 06/23/2018 CLINICAL DATA:  Diffuse abdominal pain. Metastatic renal cell carcinoma. EXAM: CT ABDOMEN AND PELVIS WITHOUT CONTRAST TECHNIQUE: Multidetector CT imaging of the abdomen and pelvis was performed following the standard protocol without IV contrast. COMPARISON:  03/09/2018 FINDINGS: Lower chest: Increased  cardiomegaly. New small right pleural effusion and mild dependent atelectasis. New infiltrate or atelectasis in the posterior left lower lobe. Hepatobiliary: No mass visualized on this unenhanced exam. Gallbladder is unremarkable. Pancreas: No mass or inflammatory process visualized on this unenhanced exam. Spleen:  Within normal limits in size. Adrenals/Urinary tract: Large soft tissue mass is again seen in the left renal hilum and collecting system which measures 5.8 x 5.1 cm. This is increased in size from 4.7 x 3.9 cm on prior study. Stomach/Bowel: No evidence of obstruction, inflammatory process, or abnormal fluid collections. Diverticulosis is seen mainly involving the sigmoid colon, however there is  no evidence of diverticulitis. Vascular/Lymphatic: Increased size of soft tissue mass or lymphadenopathy is seen in the left paraaortic region and involving the left psoas muscle. This is also increased in size currently measuring 8.1 x 5.7 cm compared to 5.4 x 3.8 cm previously. Increased size of aortic oval lymph node is also seen which currently measures 1.6 cm compared to 0.7 cm previously. No pelvic lymphadenopathy identified. No evidence of abdominal aortic aneurysm. Reproductive:  No mass or other significant abnormality. Other: New mild ascites and diffuse mesenteric edema compared to prior exam. Musculoskeletal:  No suspicious bone lesions identified. IMPRESSION: 1. Increased size of large soft tissue mass involving the left renal hilum and collecting system. 2. Increased size of retroperitoneal lymphadenopathy, consistent with metastatic disease. 3. New mild ascites and diffuse mesenteric edema. 4. New small right pleural effusion and left lower lobe infiltrate versus atelectasis. 5. Colonic diverticulosis. No radiographic evidence of diverticulitis. Electronically Signed   By: Earle Gell M.D.   On: 06/23/2018 15:25   Ct Chest W Contrast  Result Date: 05/03/2018 CLINICAL DATA:  Malignant left renal  mass with left para-aortic adenopathy, biopsy of which demonstrated "poorly differentiated metastatic carcinoma, favor renal cell carcinoma" on 04/16/2018. Chest staging. EXAM: CT CHEST WITH CONTRAST TECHNIQUE: Multidetector CT imaging of the chest was performed during intravenous contrast administration. CONTRAST:  77mL OMNIPAQUE IOHEXOL 300 MG/ML  SOLN COMPARISON:  03/09/2018 CT abdomen/pelvis. FINDINGS: Cardiovascular: Normal heart size. Small pericardial effusion. Three-vessel coronary atherosclerosis. Mildly atherosclerotic nonaneurysmal thoracic aorta. Normal caliber pulmonary arteries. No central pulmonary emboli. Mediastinum/Nodes: Mildly heterogeneous thyroid gland without discrete thyroid nodules. Unremarkable esophagus. No pathologically enlarged axillary, mediastinal or hilar lymph nodes. Lungs/Pleura: No pneumothorax. No pleural effusion. Moderate centrilobular and paraseptal emphysema. No acute consolidative airspace disease or lung masses. Numerous (at least 12) solid pulmonary nodules scattered in both lungs, largest 7 mm in the right upper lobe (series 3/image 44) and 6 mm in the left lower lobe (series 3/image 82). Upper abdomen: Partially visualized heterogeneously enhancing left renal pelvis mass measuring 6.5 cm (series 2/image 172), increased from 5.8 cm on 03/09/2018 CT. Partially visualized infiltrative left para-aortic adenopathy measuring at least 5.0 cm short axis diameter (series 2/image 177), increased from 3.7 cm. Nonspecific hypervascular 1.0 cm focus in the segment 3 left liver lobe (series 2/image 169), not definitely seen on prior CT, possibly due to differences in contrast timing. Musculoskeletal: No aggressive appearing focal osseous lesions. Symmetric mild gynecomastia. Mild thoracic spondylosis. IMPRESSION: 1. Numerous (at least 12) solid subcentimeter pulmonary nodules in both lungs, suspicious for pulmonary metastases. 2. Interval growth of malignant left renal pelvis mass. 3.  Interval growth of left para-aortic infiltrative metastatic adenopathy. 4. Small pericardial effusion. 5. Three-vessel coronary atherosclerosis. Aortic Atherosclerosis (ICD10-I70.0) and Emphysema (ICD10-J43.9). Electronically Signed   By: Ilona Sorrel M.D.   On: 05/03/2018 15:38   Dg Chest Portable 1 View  Result Date: 06/25/2018 CLINICAL DATA:  Hx of metastatic renal cell CA, now with increasing SOB Former smoker 2019 EXAM: PORTABLE CHEST - 1 VIEW COMPARISON:  CT 05/03/2018 FINDINGS: Left retrocardiac consolidation/atelectasis, partially obscuring the left diaphragmatic leaflet. Right lung clear. The small nodules seen on the prior study are not conspicuous on chest radiography. Heart size within normal limits. Blunting of the left lateral costophrenic angle suggesting possible small effusion. Visualized bones unremarkable. IMPRESSION: Left retrocardiac consolidation/atelectasis with possible small effusion. Electronically Signed   By: Lucrezia Europe M.D.   On: 06/03/2018 11:37    PERFORMANCE STATUS (ECOG) : 4 -  Bedbound  Review of Systems As noted above. Otherwise, a complete review of systems is negative.  Physical Exam General: Ill-appearing Cardiovascular: regular rate and rhythm Pulmonary: clear ant fields Abdomen: soft, nontender, + bowel sounds GU: Condom cath Extremities: no edema Skin: no rashes Neurological: Wakes when stimulated, confused, yells out, agitated, does not follow commands  IMPRESSION: Patient known to me from the clinic.  His mental status is markedly different from baseline.  Generally, patient is able to answer questions and even participates in decision-making.  Patient is currently agitated.  Suspect hypoactive delirium in the setting of infection.  However, would recommend checking CT of the head.  Recommend checking EKG and then starting haloperidol 1 to 2 mg IV every 6 hours as needed for agitation.  I called and spoke with patient's brother and sister-in-law by  phone.  Updated them on patient's current clinical status.  We discussed options of continued care versus a transition to comfort care and/or hospice in the event that patient declines or does not respond to medical treatment.  Brother is in agreement with the current scope of treatment.  He would like to speak with Dr. Tasia Catchings.  I did discuss CODE STATUS.  Brother confirms that patient would not want to be resuscitated or have his life prolonged artificially on machines.  He says patient should be a DNR.    Case discussed with Dr. Tasia Catchings.  I also spoke with Dr. Anselm Jungling.  PLAN: -Continue current scope of treatment -DNR -Recommend checking EKG and then starting Haldol 1 to 2 mg IV every 6 hours as needed for agitation -Recommend CT of the head -Recommend oncology consult  Time Total: 60 minutes  Visit consisted of counseling and education dealing with the complex and emotionally intense issues of symptom management and palliative care in the setting of serious and potentially life-threatening illness.Greater than 50%  of this time was spent counseling and coordinating care related to the above assessment and plan.  Signed by: Altha Harm, PhD, NP-C [phone] (Work Cell)

## 2018-05-28 NOTE — Progress Notes (Signed)
Patient refused lab draw this AM.

## 2018-05-28 NOTE — Progress Notes (Signed)
Family Meeting Note  Advance Directive:yes  Today a meeting took place with the brother, Nephew.  Patient is unable to participate due XJ:DBZMCE capacity Anxious, agony due to significant pain   The following clinical team members were present during this meeting:MD  The following were discussed:Patient's diagnosis: Metastatic cancer, significant pain, pneumonia, dysphagia, Patient's progosis: Unable to determine and Goals for treatment: Full Code  I had discussion with patient's brother and nephew today on his CT scan findings and his overall very poor prognosis and him being suffering with significant pain and being high risk of recurrent aspirations due to dysphagia.  They had also asked me about possibility of feeding tube but I informed that that would not help much and overall recovery.  They would like to meet with palliative care to make some decisions.  Additional follow-up to be provided: Palliative care  Time spent during discussion:20 minutes  Vaughan Basta, MD

## 2018-05-28 NOTE — Progress Notes (Deleted)
Patient had bowel movement mostly containing blood with clots.

## 2018-05-29 ENCOUNTER — Ambulatory Visit: Payer: Medicare Other

## 2018-05-29 ENCOUNTER — Inpatient Hospital Stay: Payer: Medicare Other

## 2018-05-29 ENCOUNTER — Inpatient Hospital Stay
Admit: 2018-05-29 | Discharge: 2018-05-29 | Disposition: A | Discharge status: Home / Self Care | Payer: Medicare Other | Attending: Internal Medicine | Admitting: Internal Medicine

## 2018-05-29 ENCOUNTER — Inpatient Hospital Stay: Payer: Medicare Other | Admitting: Oncology

## 2018-05-29 DIAGNOSIS — J9601 Acute respiratory failure with hypoxia: Secondary | ICD-10-CM

## 2018-05-29 DIAGNOSIS — J189 Pneumonia, unspecified organism: Principal | ICD-10-CM

## 2018-05-29 DIAGNOSIS — Z7189 Other specified counseling: Secondary | ICD-10-CM

## 2018-05-29 DIAGNOSIS — R0902 Hypoxemia: Secondary | ICD-10-CM

## 2018-05-29 DIAGNOSIS — Z515 Encounter for palliative care: Secondary | ICD-10-CM

## 2018-05-29 DIAGNOSIS — G9341 Metabolic encephalopathy: Secondary | ICD-10-CM

## 2018-05-29 DIAGNOSIS — C649 Malignant neoplasm of unspecified kidney, except renal pelvis: Secondary | ICD-10-CM

## 2018-05-29 DIAGNOSIS — R410 Disorientation, unspecified: Secondary | ICD-10-CM

## 2018-05-29 LAB — BASIC METABOLIC PANEL
Anion gap: 9 (ref 5–15)
Anion gap: 9 (ref 5–15)
BUN: 59 mg/dL — ABNORMAL HIGH (ref 8–23)
BUN: 60 mg/dL — ABNORMAL HIGH (ref 8–23)
CALCIUM: 8.4 mg/dL — AB (ref 8.9–10.3)
CHLORIDE: 116 mmol/L — AB (ref 98–111)
CHLORIDE: 117 mmol/L — AB (ref 98–111)
CO2: 25 mmol/L (ref 22–32)
CO2: 23 mmol/L (ref 22–32)
CREATININE: 2.56 mg/dL — AB (ref 0.61–1.24)
Calcium: 8.5 mg/dL — ABNORMAL LOW (ref 8.9–10.3)
Creatinine, Ser: 2.31 mg/dL — ABNORMAL HIGH (ref 0.61–1.24)
GFR calc Af Amer: 29 mL/min — ABNORMAL LOW (ref >60)
GFR calc Af Amer: 33 mL/min — ABNORMAL LOW (ref >60)
GFR calc non Af Amer: 25 mL/min — ABNORMAL LOW (ref >60)
GFR calc non Af Amer: 28 mL/min — ABNORMAL LOW (ref >60)
Glucose, Bld: 95 mg/dL (ref 70–99)
Glucose, Bld: 88 mg/dL (ref 70–99)
Potassium: 5.5 mmol/L — ABNORMAL HIGH (ref 3.5–5.1)
Potassium: 5.4 mmol/L — ABNORMAL HIGH (ref 3.5–5.1)
Sodium: 150 mmol/L — ABNORMAL HIGH (ref 135–145)
Sodium: 149 mmol/L — ABNORMAL HIGH (ref 135–145)

## 2018-05-29 LAB — BLOOD GAS, ARTERIAL
Acid-base deficit: 4.4 mmol/L — ABNORMAL HIGH (ref 0.0–2.0)
Bicarbonate: 22.1 mmol/L (ref 20.0–28.0)
FIO2: 1
O2 Saturation: 85.2 %
PATIENT TEMPERATURE: 37
pCO2 arterial: 45 mmHg (ref 32.0–48.0)
pH, Arterial: 7.3 — ABNORMAL LOW (ref 7.350–7.450)
pO2, Arterial: 56 mmHg — ABNORMAL LOW (ref 83.0–108.0)

## 2018-05-29 LAB — HIV ANTIBODY (ROUTINE TESTING W REFLEX): HIV Screen 4th Generation wRfx: NONREACTIVE

## 2018-05-29 MED ORDER — HYDROMORPHONE HCL 1 MG/ML IJ SOLN
0.5000 mg | Freq: Once | INTRAMUSCULAR | Status: AC
  Administered 2018-05-29: 0.5 mg via INTRAVENOUS
  Filled 2018-05-29: qty 0.5

## 2018-05-29 MED ORDER — SODIUM CHLORIDE 0.45 % IV SOLN
INTRAVENOUS | Status: DC
  Administered 2018-05-29 – 2018-05-30 (×2): via INTRAVENOUS

## 2018-05-29 MED ORDER — SODIUM POLYSTYRENE SULFONATE 15 GM/60ML PO SUSP
60.0000 g | Freq: Once | ORAL | Status: DC
  Filled 2018-05-29: qty 240

## 2018-05-29 MED ORDER — SODIUM CHLORIDE 0.9 % IV SOLN
1.0000 g | Freq: Two times a day (BID) | INTRAVENOUS | Status: DC
  Administered 2018-05-29 (×2): 1 g via INTRAVENOUS
  Filled 2018-05-29 (×4): qty 1

## 2018-05-29 MED ORDER — METHYLPREDNISOLONE SODIUM SUCC 125 MG IJ SOLR
60.0000 mg | Freq: Three times a day (TID) | INTRAMUSCULAR | Status: DC
  Administered 2018-05-29 – 2018-05-30 (×3): 60 mg via INTRAVENOUS
  Filled 2018-05-29 (×3): qty 2

## 2018-05-29 MED ORDER — HALOPERIDOL LACTATE 5 MG/ML IJ SOLN
1.0000 mg | Freq: Four times a day (QID) | INTRAMUSCULAR | Status: DC | PRN
  Filled 2018-05-29: qty 0.4

## 2018-05-29 MED ORDER — HYDROMORPHONE HCL 1 MG/ML IJ SOLN
0.5000 mg | INTRAMUSCULAR | Status: DC | PRN
  Administered 2018-05-29: 0.5 mg via INTRAVENOUS
  Filled 2018-05-29: qty 0.5

## 2018-05-29 MED ORDER — HYDROMORPHONE HCL 1 MG/ML IJ SOLN
1.0000 mg | INTRAMUSCULAR | Status: DC | PRN
  Administered 2018-05-29: 1 mg via INTRAVENOUS
  Filled 2018-05-29: qty 1

## 2018-05-29 MED ORDER — HYDROMORPHONE HCL 1 MG/ML IJ SOLN
1.0000 mg | INTRAMUSCULAR | Status: DC | PRN
  Administered 2018-05-29 – 2018-05-30 (×3): 1 mg via INTRAVENOUS
  Filled 2018-05-29 (×3): qty 1

## 2018-05-29 NOTE — Progress Notes (Signed)
[place] [facility] [place] NAME: Keith Daniels    MR#:  [id number]  DATE OF BIRTH:  1950/09/27  SUBJECTIVE:  CHIEF COMPLAINT:   Chief Complaint  Patient presents with  . Altered Mental Status   Came with altered mental status.  Has been moaning with pain today.  Remained confused.  Failed swallow evaluation.  CT scan showed worsening spread of his cancer. Since last night he has worsening hypoxia and he is on nonrebreather mask.  Minimal urine output.  REVIEW OF SYSTEMS:   Patient is drowsy and altered mental status cannot give reliable review of system. ROS  DRUG ALLERGIES:  No Known Allergies  VITALS:  Blood pressure 111/70, pulse 89, temperature 97.9 F (36.6 C), resp. rate 19, SpO2 (!) 89 %.  PHYSICAL EXAMINATION:   GENERAL:  68 y.o.-year-old patient lying in the bed with  acute distress.  Moaning constantly. EYES: Pupils equal, round, reactive to light and accommodation. No scleral icterus. HEENT: Head atraumatic, normocephalic. Oropharynx and nasopharynx clear.  Very bad oral hygiene and dry mucosa. NECK:  Supple, no jugular venous distention. No thyroid enlargement, no tenderness.  LUNGS: Decreased breath sounds bilaterally, no wheezing.  Scattered rhonchi, positive for use of accessory muscles of respiration.  Using nonrebreather mask. CARDIOVASCULAR: S1, S2 normal. No murmurs, rubs, or gallops.  ABDOMEN: Generalized abdominal tenderness and tense feeling. Bowel sounds present. No organomegaly or mass.  EXTREMITIES: No pedal edema, cyanosis, or clubbing.  NEUROLOGIC: Patient able to move his arms.  Difficult to test because the patient is yelling the entire time, he does not seem to be moving his lower limbs. PSYCHIATRIC: The patient is drowsy, disoriented and yelling in pain hard to focus.[name]  SKIN: No rash, lesion, or ulcer.  Physical Exam LABORATORY PANEL:   CBC Recent Labs  Lab 05/28/18 1753  WBC 7.6  HGB 10.3*  HCT 33.7*   PLT 207   ------------------------------------------------------------------------------------------------------------------  Chemistries  Recent Labs  Lab 05/28/18 1753  05/29/18 1132  NA 145   < > 149*  K 5.1   < > 5.4*  CL 114*   < > 117*  CO2 19*   < > 23  GLUCOSE 104*   < > 88  BUN 56*   < > 60*  CREATININE 2.41*   < > 2.31*  CALCIUM 8.2*   < > 8.5*  AST 63*  --   --   ALT 106*  --   --   ALKPHOS 55  --   --   BILITOT 0.7  --   --    < > = values in this interval not displayed.   ------------------------------------------------------------------------------------------------------------------  Cardiac Enzymes Recent Labs  Lab 06/13/2018 1041 05/28/18 1753  TROPONINI 0.59* 0.28*   ------------------------------------------------------------------------------------------------------------------  RADIOLOGY:  Dg Chest 1 View  Result Date: 05/29/2018 CLINICAL DATA:  Altered mental status EXAM: CHEST  1 VIEW COMPARISON:  05/28/2018 FINDINGS: Cardiomegaly. Left lower lobe airspace opacity persists, unchanged. Small bilateral pleural effusions suspected. Mild vascular congestion. No real change. IMPRESSION: No significant change since prior study. Electronically Signed   By: Rolm Baptise M.D.   On: 05/29/2018 09:48   Dg Chest 1 View  Result Date: 05/28/2018 CLINICAL DATA:  Hypotensive. History of metastatic renal cell carcinoma and pneumonia. EXAM: CHEST  1 VIEW COMPARISON:  Chest radiograph May 27, 2018 FINDINGS: Persistent retrocardiac consolidation with air bronchograms. Small pleural effusion. Hazy density RIGHT lung base. Cardiac silhouette is mildly enlarged. Mediastinal silhouette  is not suspicious. Apical bullous changes. No pneumothorax. Soft tissue planes and included osseous structures are unchanged. IMPRESSION: 1. Persistent retrocardiac consolidation and volume loss. Small pleural effusions. 2. Stable cardiomegaly. Electronically Signed   By: Elon Alas M.D.    On: 05/28/2018 18:33   Ct Head Wo Contrast  Result Date: 05/28/2018 CLINICAL DATA:  Altered mental status. Metastatic renal cell carcinoma. EXAM: CT HEAD WITHOUT CONTRAST TECHNIQUE: Contiguous axial images were obtained from the base of the skull through the vertex without intravenous contrast. COMPARISON:  None. FINDINGS: Brain: There is no mass, hemorrhage or extra-axial collection. There is generalized atrophy without lobar predilection. Hypodensity of the white matter is most commonly associated with chronic microvascular disease. Vascular: No abnormal hyperdensity of the major intracranial arteries or dural venous sinuses. No intracranial atherosclerosis. Skull: The visualized skull base, calvarium and extracranial soft tissues are normal. Sinuses/Orbits: No fluid levels or advanced mucosal thickening of the visualized paranasal sinuses. No mastoid or middle ear effusion. The orbits are normal. IMPRESSION: 1. No acute intracranial abnormality. 2. Generalized atrophy and findings of chronic microvascular ischemia. Electronically Signed   By: Ulyses Jarred M.D.   On: 05/28/2018 18:19    ASSESSMENT AND PLAN:   Active Problems:   Renal cell carcinoma ([place])   Community acquired pneumonia   Metabolic encephalopathy   Hypoxia   Acute respiratory failure with hypoxia ([place])  1.  Acute hypoxic respiratory failure with a pulse ox of 88% on nonrebreather mask.  Oxygen supplementation He could not tolerate high flow nasal cannula. Chest x-ray does not show new significant changes, this could be due to underlying cancer and pneumonia.  I have called pulmonary consult for further help. 2.  Pneumonia.  IV Rocephin and Zithromax .   Change to cefepime due to worsening condition.  MRSA negative.  3.  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  Patient with abdominal pain.      His cancer load is worse on CT scan Called oncology for consult and goals of care discussion with patient's family. 4.   History of traumatic brain injury and bedbound 5.  Severe malnutrition 6.  Acute kidney injury on chronic kidney disease.  Gentle IV fluid hydration.    Also given Kayexalate enema, kidney function continued to get worse with minimal urine output so called nephrology consult. 7.  Hyperkalemia secondary to acute kidney injury.  Give IV fluids overnight.  Kayexalate enema was given. 8.  Depression continue psychiatric medications 9.  Dysphagia-failed swallow evaluation.  I had discussion with patient's brother and nephew today on his CT scan findings and his overall very poor prognosis and him being suffering with significant pain and being high risk of recurrent aspirations due to dysphagia.  They had also asked me about possibility of feeding tube but I informed that that would not help much and overall recovery.   Patient is DNR now. After discussing with oncology, power of attorney had decided to continue current level of care but keep him DNR, DNI.  All the records are reviewed and case discussed with Care Management/Social Workerr. Management plans discussed with the patient, family and they are in agreement.  CODE STATUS: Full.  TOTAL TIME TAKING CARE OF THIS PATIENT: 45 minutes.  Discussed with pulmonologist, nephrologist, oncologist.  POSSIBLE D/C IN 1-2 DAYS, DEPENDING ON CLINICAL CONDITION.   Vaughan Basta M.D on 05/29/2018   Between 7am to 6pm - Pager - [phone]  After 6pm go to [email or web address] - [place]  [facility]  Office  [phone]  CC: Primary care physician; [name], MD  Note: This dictation was prepared with Dragon dictation along with smaller phrase technology. Any transcriptional errors that result from this process are unintentional.

## 2018-05-29 NOTE — Consult Note (Signed)
[facility]  CONSULT NOTE    Date: 05/29/2018                  Patient Name:  Keith Daniels  MRN: [id number]  DOB: 05-25-1950  Age / Sex: 68 y.o., male         PCP: [name], MD                 Service Requesting Consult: Dr. Anselm Jungling                 Reason for Consult: Acute renal failure            History of Present Illness: [name] is a 68 y.o. black male who is admitted with altered mental status. He is currently at [facility]. He ahs metastatic renal cell carcinoma  Unable to get history from patient. Continues to have pain.    Medications: Outpatient medications: Medications Prior to Admission  Medication Sig Dispense Refill Last Dose  . axitinib (INLYTA) 5 MG tablet Take 1 tablet (5 mg total) by mouth 2 (two) times daily. 60 tablet 0 06/17/2018 at 0900  . escitalopram (LEXAPRO) 10 MG tablet Take 10 mg by mouth daily.   06/20/2018 at 0800  . Melatonin 300 MCG TABS Take 1.5 tablets by mouth at bedtime.    05/26/2018 at 2100  . MULTIPLE VITAMIN PO Take 1 tablet by mouth daily.   06/21/2018 at 0900  . ondansetron (ZOFRAN) 8 MG tablet Take 1 tablet (8 mg total) by mouth 2 (two) times daily. 20 tablet 2 06/04/2018 at 0900  . oseltamivir (TAMIFLU) 30 MG capsule Take 30 mg by mouth daily.   06/21/2018 at 0900  . thiamine (VITAMIN B-1) 100 MG tablet Take 100 mg by mouth daily.   06/03/2018 at 0900  . traMADol (ULTRAM) 50 MG tablet Take by mouth every 12 (twelve) hours as needed.   06/10/2018 at 0900    Current medications: Current Facility-Administered Medications  Medication Dose Route Frequency Provider Last Rate Last Dose  . 0.45 % sodium chloride infusion   Intravenous Continuous Lovelace Cerveny, MD 75 mL/hr at 05/29/18 1353    . acetaminophen (TYLENOL) tablet 650 mg  650 mg Oral Q6H PRN Loletha Grayer, MD       Or  . acetaminophen (TYLENOL) suppository 650 mg  650 mg Rectal Q6H PRN Wieting, Richard, MD      . ceFEPIme (MAXIPIME) 1  g in sodium chloride 0.9 % 100 mL IVPB  1 g Intravenous Q12H Lu Duffel, RPH 200 mL/hr at 05/29/18 1356 1 g at 05/29/18 1356  . chlorhexidine (PERIDEX) 0.12 % solution 15 mL  15 mL Mouth Rinse BID Vaughan Basta, MD   15 mL at 05/29/18 1457  . haloperidol lactate (HALDOL) injection 1-2 mg  1-2 mg Intravenous Q6H PRN Borders, Kirt Boys, NP      . heparin injection 5,000 Units  5,000 Units Subcutaneous Q8H Loletha Grayer, MD   5,000 Units at 05/29/18 1500  . HYDROmorphone (DILAUDID) injection 1 mg  1 mg Intravenous Q3H PRN Basilio Cairo, NP      . MEDLINE mouth rinse  15 mL Mouth Rinse q12n4p Vaughan Basta, MD   15 mL at 05/28/18 1641  . methylPREDNISolone sodium succinate (SOLU-MEDROL) 125 mg/2 mL injection 60 mg  60 mg Intravenous Q8H Laverle Hobby, MD   60 mg at 05/29/18 1458  . sodium chloride flush (NS) 0.9 % injection 3 mL  3 mL Intravenous Once Wieting, Richard, MD      . sodium polystyrene (KAYEXALATE) 15 GM/60ML suspension 60 g  60 g Rectal Once Vaughan Basta, MD          Allergies: No Known Allergies    Past Medical History: Past Medical History:  Diagnosis Date  . Alcohol dependence in remission ([place])   . Chewing tobacco nicotine dependence, uncomplicated   . Chronic viral hepatitis C ([place])   . Cognitive communication deficit   . 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)   . Dysphagia, oropharyngeal phase   . Essential hypertension   . Extended spectrum beta lactamase (ESBL) resistance   . Gross hematuria   . History of traumatic brain injury   . Major depressive disorder   . Metastatic renal cell carcinoma ([place]) 05/11/2018  . Muscle weakness (generalized)   . Unspecified severe protein-calorie malnutrition ([place])   . Urinary tract infection, site not specified      Past Surgical History: Past Surgical History:  Procedure Laterality Date  . TESTICLE REMOVAL       Family History: Family History  Problem Relation Age of Onset   . Kidney disease Mother   . Pneumonia Father   . Hypertension Brother   . Prostate cancer Brother      Social History: Social History   Socioeconomic History  . Marital status: Single    Spouse name: Not on file  . Number of children: Not on file  . Years of education: Not on file  . Highest education level: Not on file  Occupational History  . Not on file  Social Needs  . Financial resource strain: Not on file  . Food insecurity:    Worry: Not on file    Inability: Not on file  . Transportation needs:    Medical: Not on file    Non-medical: Not on file  Tobacco Use  . Smoking status: Former Smoker    Packs/day: 1.00    Years: 10.00    Pack years: 10.00    Types: Cigarettes    Last attempt to quit: 04/03/2017    Years since quitting: 1.1  . Smokeless tobacco: Never Used  Substance and Sexual Activity  . Alcohol use: Not Currently    Comment: quit in 2019  . Drug use: Not Currently    Types: "Crack" cocaine    Comment: quit in 2019  . Sexual activity: Yes  Lifestyle  . Physical activity:    Days per week: Not on file    Minutes per session: Not on file  . Stress: Not on file  Relationships  . Social connections:    Talks on phone: Not on file    Gets together: Not on file    Attends religious service: Not on file    Active member of club or organization: Not on file    Attends meetings of clubs or organizations: Not on file    Relationship status: Not on file  . Intimate partner violence:    Fear of current or ex partner: Not on file    Emotionally abused: Not on file    Physically abused: Not on file    Forced sexual activity: Not on file  Other Topics Concern  . Not on file  Social History Narrative  . Not on file     Review of Systems: Review of Systems  Unable to perform ROS: Mental status change    Vital Signs: Blood pressure 111/70, pulse 89, temperature 97.9  F (36.6 C), resp. rate 19, SpO2 (!) 89 %.  Weight trends: There were no  vitals filed for this visit.  Physical Exam: General: Cachectic  Head: Normocephalic, atraumatic. Moist oral mucosal membranes  Eyes: Anicteric, PERRL  Neck: Supple, trachea midline  Lungs:  diminished bilaterally.   Heart: Regular rate and rhythm  Abdomen:  Soft, nontender,   Extremities:  no peripheral edema.  Neurologic: Nonfocal, moving all four extremities  Skin: No lesions         Lab results: Basic Metabolic Panel: Recent Labs  Lab 05/28/18 1753 05/29/18 0337 05/29/18 1132  NA 145 150* 149*  K 5.1 5.5* 5.4*  CL 114* 116* 117*  CO2 19* 25 23  GLUCOSE 104* 95 88  BUN 56* 59* 60*  CREATININE 2.41* 2.56* 2.31*  CALCIUM 8.2* 8.4* 8.5*    Liver Function Tests: Recent Labs  Lab 05/23/18 1345 06/17/2018 1041 05/28/18 1753  AST 21 74* 63*  ALT 19 81* 106*  ALKPHOS 74 54 55  BILITOT 0.5 0.9 0.7  PROT 8.0 7.1 6.4*  ALBUMIN 3.3* 3.0* 2.8*   No results for input(s): LIPASE, AMYLASE in the last 168 hours. No results for input(s): AMMONIA in the last 168 hours.  CBC: Recent Labs  Lab 05/23/18 1345 06/05/2018 1041 05/28/18 0751 05/28/18 1753  WBC 2.9* 7.0 6.4 7.6  NEUTROABS 1.9  --   --   --   HGB 11.8* 10.5* 10.3* 10.3*  HCT 36.6* 33.1* 33.0* 33.7*  MCV 85.9 86.4 88.0 89.2  PLT 177 172 179 207    Cardiac Enzymes: Recent Labs  Lab 06/01/2018 1041 05/28/18 1753  TROPONINI 0.59* 0.28*    BNP: Invalid input(s): POCBNP  CBG: Recent Labs  Lab 05/28/18 1721  GLUCAP 83    Microbiology: Results for orders placed or performed during the hospital encounter of 06/14/2018  Blood culture (routine x 2)     Status: None (Preliminary result)   Collection Time: 06/13/2018 11:25 AM  Result Value Ref Range Status   Specimen Description BLOOD Blood Culture adequate volume  Final   Special Requests   Final    BOTTLES DRAWN AEROBIC AND ANAEROBIC LEFT ANTECUBITAL   Culture   Final    NO GROWTH 2 DAYS Performed at [facility], [address],  [place], [place] [address]    Report Status PENDING  Incomplete  Blood culture (routine x 2)     Status: None (Preliminary result)   Collection Time: 06/24/2018 11:33 AM  Result Value Ref Range Status   Specimen Description BLOOD Blood Culture adequate volume  Final   Special Requests   Final    BOTTLES DRAWN AEROBIC AND ANAEROBIC RIGHT ANTECUBITAL   Culture   Final    NO GROWTH 2 DAYS Performed at [facility], [address] [place], [place] [address]    Report Status PENDING  Incomplete  MRSA PCR Screening     Status: None   Collection Time: 05/28/18 12:32 PM  Result Value Ref Range Status   MRSA by PCR NEGATIVE NEGATIVE Final    Comment:        The GeneXpert MRSA Assay (FDA approved for NASAL specimens only), is one component of a comprehensive MRSA colonization surveillance program. It is not intended to diagnose MRSA infection nor to guide or monitor treatment for MRSA infections. Performed at [facility], [address] [place],  [address]     Coagulation Studies: No results for input(s): LABPROT, INR in the  last 72 hours.  Urinalysis: Recent Labs    06/26/2018 1329  COLORURINE AMBER*  LABSPEC 1.020  PHURINE 5.0  GLUCOSEU NEGATIVE  HGBUR LARGE*  BILIRUBINUR NEGATIVE  KETONESUR NEGATIVE  PROTEINUR 100*  NITRITE NEGATIVE  LEUKOCYTESUR SMALL*      Imaging: Dg Chest 1 View  Result Date: 05/29/2018 CLINICAL DATA:  Altered mental status EXAM: CHEST  1 VIEW COMPARISON:  05/28/2018 FINDINGS: Cardiomegaly. Left lower lobe airspace opacity persists, unchanged. Small bilateral pleural effusions suspected. Mild vascular congestion. No real change. IMPRESSION: No significant change since prior study. Electronically Signed   By: Rolm Baptise M.D.   On: 05/29/2018 09:48   Dg Chest 1 View  Result Date: 05/28/2018 CLINICAL DATA:  Hypotensive. History of metastatic renal cell carcinoma and pneumonia. EXAM: CHEST  1 VIEW COMPARISON:  Chest radiograph  May 27, 2018 FINDINGS: Persistent retrocardiac consolidation with air bronchograms. Small pleural effusion. Hazy density RIGHT lung base. Cardiac silhouette is mildly enlarged. Mediastinal silhouette is not suspicious. Apical bullous changes. No pneumothorax. Soft tissue planes and included osseous structures are unchanged. IMPRESSION: 1. Persistent retrocardiac consolidation and volume loss. Small pleural effusions. 2. Stable cardiomegaly. Electronically Signed   By: Elon Alas M.D.   On: 05/28/2018 18:33   Ct Head Wo Contrast  Result Date: 05/28/2018 CLINICAL DATA:  Altered mental status. Metastatic renal cell carcinoma. EXAM: CT HEAD WITHOUT CONTRAST TECHNIQUE: Contiguous axial images were obtained from the base of the skull through the vertex without intravenous contrast. COMPARISON:  None. FINDINGS: Brain: There is no mass, hemorrhage or extra-axial collection. There is generalized atrophy without lobar predilection. Hypodensity of the white matter is most commonly associated with chronic microvascular disease. Vascular: No abnormal hyperdensity of the major intracranial arteries or dural venous sinuses. No intracranial atherosclerosis. Skull: The visualized skull base, calvarium and extracranial soft tissues are normal. Sinuses/Orbits: No fluid levels or advanced mucosal thickening of the visualized paranasal sinuses. No mastoid or middle ear effusion. The orbits are normal. IMPRESSION: 1. No acute intracranial abnormality. 2. Generalized atrophy and findings of chronic microvascular ischemia. Electronically Signed   By: Ulyses Jarred M.D.   On: 05/28/2018 18:19      Assessment & Plan: [name] is a 68 y.o. black male with renal cell carcinoma, hypertension, traumatic brain injury who was admitted to [facility] on 06/29/100 for Metabolic encephalopathy [V25.36] Community acquired pneumonia, unspecified laterality [J18.9]  1. Acute renal failure 2. Respiratory failure 3.  Hypernatremia 4. Hyperkalemia 5. Respiratory Acidosis 6. Hematuria 7. Proteinuria.   Plan - Change to 1/2 NS infusion Overall prognosis is poor.      LOS: 2 Keith Daniels 3/3/20204:21 PM

## 2018-05-29 NOTE — Progress Notes (Signed)
Patient received a one time dose of dilaudid 0.5 due to patient complaining and moaning loudly constantly of" pain all  Over" stated patient. Patient rested after administration of dilaudid

## 2018-05-29 NOTE — Consult Note (Signed)
Hematology/Oncology Consult note [facility] Telephone:([phone] [phone]  Patient Care Team: [name], MD as PCP - General (Family Medicine)   Name of the patient: Keith Daniels  [id number]  11-07-1950   Date of visit: 05/29/18 REASON FOR COSULTATION:  Metastatic RCC, Dr.Vachhani communicated with me for consultation and goal of care discussion with family.   History of presenting illness-  68 y.o. male with PMH listed at below who was sent to ER from long-term care facility for evaluation of after the mental status, acute hypoxic respiratory failure.. Chest x-ray showed left-sided consolidation with probable pneumonia.  Abdominal CT reviewed disease progression with increased size of large soft tissue mass in the left renal healing and retroperitoneal lymphadenopathy. Oncology was consulted for further evaluation and goal of care discussion with family members. Patient's power of attorney is brother [name].  Patient was evaluated by palliative care nurse practitioner Altha Harm will communicate with me yesterday. We suggested to obtain CT of the head for evaluation of altered mental status.  Patient appears to being delirious state.  CT head was negative for acute process.  overnight.  Had a rapid response team called to the bedside for evaluating patients blood pressure being low at 69/53.  Patient was given 250 cc normal saline bolus.  With bolus infusing, patient is slightly became more alert and transition to flat.  Blood pressure 112/77.  On admission labs showed  hemoglobin of 10.5, white count 7.0.  Sodium 145, potassium 5.5, creatinine 1.59.  Since admission, her kidney function has worsened today's creatinine is 2.31.  Patient has been on continuous IV fluid with normal saline at 100 cc/h. Not able to obtain history from patient due to his mental status.  I discussed with patient's nurse at bedside and obtained history.   Patient was able to communicate to RN about pain.  Just received IV Dilaudid.   Review of Systems  Unable to perform ROS: Mental status change    No Known Allergies  Patient Active Problem List   Diagnosis Date Noted  . Metabolic encephalopathy   . Pneumonia 06/12/2018  . Palliative care encounter 05/24/2018  . Weakness generalized 05/24/2018  . Anorexic 05/24/2018  . Normocytic anemia 05/24/2018  . AKI (acute kidney injury) ([place]) 05/23/2018  . Renal cell carcinoma of left kidney ([place]) 05/16/2018  . Metastatic renal cell carcinoma ([place]) 05/11/2018  . Goals of care, counseling/discussion 04/28/2018  . 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 Traumatic brain injury ([place]) 11/25/2016  . Delirium 11/18/2016  . Hypercalcemia 10/26/2016  . Hypernatremia 10/26/2016  . Prolonged QT interval 10/26/2016  . Hypertension 10/14/2016  . Chronic hepatitis C virus infection ([place]) 11/16/2014  . Tobacco use disorder 02/01/2012     Past Medical History:  Diagnosis Date  . Alcohol dependence in remission ([place])   . Chewing tobacco nicotine dependence, uncomplicated   . Chronic viral hepatitis C ([place])   . Cognitive communication deficit   . Diffuse traumatic brain injury ([place])   . Dysphagia, oropharyngeal phase   . Essential hypertension   . Extended spectrum beta lactamase (ESBL) resistance   . Gross hematuria   . History of traumatic brain injury   . Major depressive disorder   . Metastatic renal cell carcinoma ([place]) 05/11/2018  . Muscle weakness (generalized)   . Unspecified severe protein-calorie malnutrition ([place])   . Urinary tract infection, site not specified      Past Surgical History:  Procedure  Laterality Date  . TESTICLE REMOVAL      Social History   Socioeconomic History  . Marital status: Single    Spouse name: Not on file  . Number of children: Not on file  . Years of education: Not on file  . Highest education level: Not on  file  Occupational History  . Not on file  Social Needs  . Financial resource strain: Not on file  . Food insecurity:    Worry: Not on file    Inability: Not on file  . Transportation needs:    Medical: Not on file    Non-medical: Not on file  Tobacco Use  . Smoking status: Former Smoker    Packs/day: 1.00    Years: 10.00    Pack years: 10.00    Types: Cigarettes    Last attempt to quit: 04/03/2017    Years since quitting: 1.1  . Smokeless tobacco: Never Used  Substance and Sexual Activity  . Alcohol use: Not Currently    Comment: quit in 2019  . Drug use: Not Currently    Types: "Crack" cocaine    Comment: quit in 2019  . Sexual activity: Yes  Lifestyle  . Physical activity:    Days per week: Not on file    Minutes per session: Not on file  . Stress: Not on file  Relationships  . Social connections:    Talks on phone: Not on file    Gets together: Not on file    Attends religious service: Not on file    Active member of club or organization: Not on file    Attends meetings of clubs or organizations: Not on file    Relationship status: Not on file  . Intimate partner violence:    Fear of current or ex partner: Not on file    Emotionally abused: Not on file    Physically abused: Not on file    Forced sexual activity: Not on file  Other Topics Concern  . Not on file  Social History Narrative  . Not on file     Family History  Problem Relation Age of Onset  . Kidney disease Mother   . Pneumonia Father   . Hypertension Brother   . Prostate cancer Brother      Current Facility-Administered Medications:  .  0.9 %  sodium chloride infusion, , Intravenous, Continuous, Vaughan Basta, MD, Last Rate: 100 mL/hr at 05/29/18 1026 .  acetaminophen (TYLENOL) tablet 650 mg, 650 mg, Oral, Q6H PRN **OR** acetaminophen (TYLENOL) suppository 650 mg, 650 mg, Rectal, Q6H PRN, Wieting, Richard, MD .  cefTRIAXone (ROCEPHIN) 2 g in sodium chloride 0.9 % 100 mL IVPB, 2 g,  Intravenous, Q24H, Loletha Grayer, MD, Stopped at 05/28/18 1301 .  chlorhexidine (PERIDEX) 0.12 % solution 15 mL, 15 mL, Mouth Rinse, BID, Vaughan Basta, MD, 15 mL at 05/28/18 1636 .  doxycycline (VIBRA-TABS) tablet 100 mg, 100 mg, Oral, Q12H, Wieting, Richard, MD, 100 mg at 05/28/18 2243 .  escitalopram (LEXAPRO) tablet 10 mg, 10 mg, Oral, Daily, Wieting, Richard, MD .  haloperidol lactate (HALDOL) injection 1-2 mg, 1-2 mg, Intravenous, Q6H PRN, Borders, Kirt Boys, NP .  heparin injection 5,000 Units, 5,000 Units, Subcutaneous, Q8H, Loletha Grayer, MD, 5,000 Units at 05/29/18 [phone] .  HYDROmorphone (DILAUDID) injection 0.5 mg, 0.5 mg, Intravenous, Q4H PRN, Basilio Cairo, NP, 0.5 mg at 05/29/18 1058 .  MEDLINE mouth rinse, 15 mL, Mouth Rinse, q12n4p, Vaughan Basta, MD, 15 mL at 05/28/18  1641 .  Melatonin TABS 5 mg, 1 tablet, Oral, QHS, Wieting, Richard, MD .  multivitamin with minerals tablet 1 tablet, 1 tablet, Oral, Daily, Wieting, Richard, MD .  oxyCODONE (Oxy IR/ROXICODONE) immediate release tablet 5 mg, 5 mg, Oral, Q4H PRN, Loletha Grayer, MD, 5 mg at 05/28/18 2254 .  sodium chloride flush (NS) 0.9 % injection 3 mL, 3 mL, Intravenous, Once, Wieting, Richard, MD .  sodium polystyrene (KAYEXALATE) 15 GM/60ML suspension 60 g, 60 g, Rectal, Once, Vaughan Basta, MD .  thiamine (VITAMIN B-1) tablet 100 mg, 100 mg, Oral, Daily, Loletha Grayer, MD   Physical exam:  Vitals:   05/29/18 1610 05/29/18 0850 05/29/18 0852 05/29/18 1210  BP: 108/78   111/70  Pulse: 94   89  Resp: 18   19  Temp: 97.9 F (36.6 C)   97.9 F (36.6 C)  TempSrc:      SpO2: (!) 84% (!) 83% (!) 88% (!) 89%   Physical Exam  HENT:  Head: Normocephalic and atraumatic.  Eyes: No scleral icterus.  Neck: Neck supple.  Cardiovascular: Normal rate.  No murmur heard. Pulmonary/Chest: Effort normal.  Decreased breath sound bilaterally.  Scattered rhonchi.   Abdominal: Soft.    Musculoskeletal:        General: No edema.  Neurological:  larthagic  Skin: Skin is warm and dry. No erythema.        CMP Latest Ref Rng & Units 05/29/2018  Glucose 70 - 99 mg/dL 88  BUN 8 - 23 mg/dL 60(H)  Creatinine 0.61 - 1.24 mg/dL 2.31(H)  Sodium 135 - 145 mmol/L 149(H)  Potassium 3.5 - 5.1 mmol/L 5.4(H)  Chloride 98 - 111 mmol/L 117(H)  CO2 22 - 32 mmol/L 23  Calcium 8.9 - 10.3 mg/dL 8.5(L)  Total Protein 6.5 - 8.1 g/dL -  Total Bilirubin 0.3 - 1.2 mg/dL -  Alkaline Phos 38 - 126 U/L -  AST 15 - 41 U/L -  ALT 0 - 44 U/L -   CBC Latest Ref Rng & Units 05/28/2018  WBC 4.0 - 10.5 K/uL 7.6  Hemoglobin 13.0 - 17.0 g/dL 10.3(L)  Hematocrit 39.0 - 52.0 % 33.7(L)  Platelets 150 - 400 K/uL 207   RADIOGRAPHIC STUDIES: I have personally reviewed the radiological images as listed and agreed with the findings in the report. Ct Abdomen Pelvis Wo Contrast  Result Date: 06/06/2018 CLINICAL DATA:  Diffuse abdominal pain. Metastatic renal cell carcinoma. EXAM: CT ABDOMEN AND PELVIS WITHOUT CONTRAST TECHNIQUE: Multidetector CT imaging of the abdomen and pelvis was performed following the standard protocol without IV contrast. COMPARISON:  03/09/2018 FINDINGS: Lower chest: Increased cardiomegaly. New small right pleural effusion and mild dependent atelectasis. New infiltrate or atelectasis in the posterior left lower lobe. Hepatobiliary: No mass visualized on this unenhanced exam. Gallbladder is unremarkable. Pancreas: No mass or inflammatory process visualized on this unenhanced exam. Spleen:  Within normal limits in size. Adrenals/Urinary tract: Large soft tissue mass is again seen in the left renal hilum and collecting system which measures 5.8 x 5.1 cm. This is increased in size from 4.7 x 3.9 cm on prior study. Stomach/Bowel: No evidence of obstruction, inflammatory process, or abnormal fluid collections. Diverticulosis is seen mainly involving the sigmoid colon, however there is no evidence  of diverticulitis. Vascular/Lymphatic: Increased size of soft tissue mass or lymphadenopathy is seen in the left paraaortic region and involving the left psoas muscle. This is also increased in size currently measuring 8.1 x 5.7 cm compared to 5.4  x 3.8 cm previously. Increased size of aortic oval lymph node is also seen which currently measures 1.6 cm compared to 0.7 cm previously. No pelvic lymphadenopathy identified. No evidence of abdominal aortic aneurysm. Reproductive:  No mass or other significant abnormality. Other: New mild ascites and diffuse mesenteric edema compared to prior exam. Musculoskeletal:  No suspicious bone lesions identified. IMPRESSION: 1. Increased size of large soft tissue mass involving the left renal hilum and collecting system. 2. Increased size of retroperitoneal lymphadenopathy, consistent with metastatic disease. 3. New mild ascites and diffuse mesenteric edema. 4. New small right pleural effusion and left lower lobe infiltrate versus atelectasis. 5. Colonic diverticulosis. No radiographic evidence of diverticulitis. Electronically Signed   By: Earle Gell M.D.   On: 06/11/2018 15:25   Dg Chest 1 View  Result Date: 05/29/2018 CLINICAL DATA:  Altered mental status EXAM: CHEST  1 VIEW COMPARISON:  05/28/2018 FINDINGS: Cardiomegaly. Left lower lobe airspace opacity persists, unchanged. Small bilateral pleural effusions suspected. Mild vascular congestion. No real change. IMPRESSION: No significant change since prior study. Electronically Signed   By: Rolm Baptise M.D.   On: 05/29/2018 09:48   Dg Chest 1 View  Result Date: 05/28/2018 CLINICAL DATA:  Hypotensive. History of metastatic renal cell carcinoma and pneumonia. EXAM: CHEST  1 VIEW COMPARISON:  Chest radiograph May 27, 2018 FINDINGS: Persistent retrocardiac consolidation with air bronchograms. Small pleural effusion. Hazy density RIGHT lung base. Cardiac silhouette is mildly enlarged. Mediastinal silhouette is not  suspicious. Apical bullous changes. No pneumothorax. Soft tissue planes and included osseous structures are unchanged. IMPRESSION: 1. Persistent retrocardiac consolidation and volume loss. Small pleural effusions. 2. Stable cardiomegaly. Electronically Signed   By: Elon Alas M.D.   On: 05/28/2018 18:33   Ct Head Wo Contrast  Result Date: 05/28/2018 CLINICAL DATA:  Altered mental status. Metastatic renal cell carcinoma. EXAM: CT HEAD WITHOUT CONTRAST TECHNIQUE: Contiguous axial images were obtained from the base of the skull through the vertex without intravenous contrast. COMPARISON:  None. FINDINGS: Brain: There is no mass, hemorrhage or extra-axial collection. There is generalized atrophy without lobar predilection. Hypodensity of the white matter is most commonly associated with chronic microvascular disease. Vascular: No abnormal hyperdensity of the major intracranial arteries or dural venous sinuses. No intracranial atherosclerosis. Skull: The visualized skull base, calvarium and extracranial soft tissues are normal. Sinuses/Orbits: No fluid levels or advanced mucosal thickening of the visualized paranasal sinuses. No mastoid or middle ear effusion. The orbits are normal. IMPRESSION: 1. No acute intracranial abnormality. 2. Generalized atrophy and findings of chronic microvascular ischemia. Electronically Signed   By: Ulyses Jarred M.D.   On: 05/28/2018 18:19   Ct Chest W Contrast  Result Date: 05/03/2018 CLINICAL DATA:  Malignant left renal mass with left para-aortic adenopathy, biopsy of which demonstrated "poorly differentiated metastatic carcinoma, favor renal cell carcinoma" on 04/16/2018. Chest staging. EXAM: CT CHEST WITH CONTRAST TECHNIQUE: Multidetector CT imaging of the chest was performed during intravenous contrast administration. CONTRAST:  60mL OMNIPAQUE IOHEXOL 300 MG/ML  SOLN COMPARISON:  03/09/2018 CT abdomen/pelvis. FINDINGS: Cardiovascular: Normal heart size. Small pericardial  effusion. Three-vessel coronary atherosclerosis. Mildly atherosclerotic nonaneurysmal thoracic aorta. Normal caliber pulmonary arteries. No central pulmonary emboli. Mediastinum/Nodes: Mildly heterogeneous thyroid gland without discrete thyroid nodules. Unremarkable esophagus. No pathologically enlarged axillary, mediastinal or hilar lymph nodes. Lungs/Pleura: No pneumothorax. No pleural effusion. Moderate centrilobular and paraseptal emphysema. No acute consolidative airspace disease or lung masses. Numerous (at least 12) solid pulmonary nodules scattered in both lungs, largest 7  mm in the right upper lobe (series 3/image 44) and 6 mm in the left lower lobe (series 3/image 82). Upper abdomen: Partially visualized heterogeneously enhancing left renal pelvis mass measuring 6.5 cm (series 2/image 172), increased from 5.8 cm on 03/09/2018 CT. Partially visualized infiltrative left para-aortic adenopathy measuring at least 5.0 cm short axis diameter (series 2/image 177), increased from 3.7 cm. Nonspecific hypervascular 1.0 cm focus in the segment 3 left liver lobe (series 2/image 169), not definitely seen on prior CT, possibly due to differences in contrast timing. Musculoskeletal: No aggressive appearing focal osseous lesions. Symmetric mild gynecomastia. Mild thoracic spondylosis. IMPRESSION: 1. Numerous (at least 12) solid subcentimeter pulmonary nodules in both lungs, suspicious for pulmonary metastases. 2. Interval growth of malignant left renal pelvis mass. 3. Interval growth of left para-aortic infiltrative metastatic adenopathy. 4. Small pericardial effusion. 5. Three-vessel coronary atherosclerosis. Aortic Atherosclerosis (ICD10-I70.0) and Emphysema (ICD10-J43.9). Electronically Signed   By: Ilona Sorrel M.D.   On: 05/03/2018 15:38   Dg Chest Portable 1 View  Result Date: 06/03/2018 CLINICAL DATA:  Hx of metastatic renal cell CA, now with increasing SOB Former smoker 2019 EXAM: PORTABLE CHEST - 1 VIEW  COMPARISON:  CT 05/03/2018 FINDINGS: Left retrocardiac consolidation/atelectasis, partially obscuring the left diaphragmatic leaflet. Right lung clear. The small nodules seen on the prior study are not conspicuous on chest radiography. Heart size within normal limits. Blunting of the left lateral costophrenic angle suggesting possible small effusion. Visualized bones unremarkable. IMPRESSION: Left retrocardiac consolidation/atelectasis with possible small effusion. Electronically Signed   By: Lucrezia Europe M.D.   On: 06/23/2018 11:37    Assessment and plan- Patient is a 68 y.o. male with history of stage IV renal cell carcinoma who just started anti-neoplasm treatment proximately 2 weeks ago, history of traumatic brain injury, long-term facility resident, hepatitis C hypertension, history of alcohol abuse and IV drug abuse currently admitted due to altered mental status, acute respiratory failure.  I have evaluated patient at the bedside.  Patient is markedly different from his baseline.  He was able to answer questions and even participate in decision-making last week.  Yesterday when he was evaluated by palliative care nurse practitioner, patient was agitated, suspecting hypoactive delirium. I agree with adding Haldol every 6 hours as needed for agitation.  EKG was reviewed.  Normal QTC CT abdomen pelvis and chest x-ray were independently reviewed by me. CT did showed disease progression comparing to mid December 2019.  He was just started on combination of immunotherapy plus axitinib approximately 2 to 3 weeks ago.  These progression are most likely existing prior to the start of treatment.    Acute altered mental status, CT negative.  Likely secondary to acute infection/pneumonia/hyponatremia/delirum.  Agree with continue IV antibiotics for pneumonia treatment.  Optimize IV fluid regimen [ consider switch to D5 or half normal saline]. Pricilla Loveless consulting nephrology for evaluation and management.   I  had a long discussion with patient's power of attorney Mr. Jemery Stacey regarding patient's critical medical condition, including an incurable metastatic cancer, low blood pressure, acute hypoxic respiratory failure, altered mental status, delirium etc. Overall prognosis is poor. Patient is currently on DNR/DNI.  Continuing current treatment vs transition to comfort care/hospice discussed with him. Patient's brother prefers to continue current treatment for now, in the event that if patient continues to decline or does not respond to medical treatment, he is okay with transitioning to hospice.   Thank you for allowing me to participate in the care of  this patient. Communicated with Dr.Vachhani via secure chat.  Total face to face encounter time for this patient visit was 70 min. >50% of the time was  spent in counseling and coordination of care.    Earlie Server, MD, PhD Hematology Oncology [facility] Pager- [phone] 05/29/2018

## 2018-05-29 NOTE — Progress Notes (Signed)
Daily Progress Note   Patient Name: Keith Daniels       Date: 05/29/2018 DOB: [birth year]  Age: 68 y.o. MRN#: [id number] Attending Physician: Vaughan Basta, * Primary Care Physician: [name], MD  Admit Date: 06/03/2018  Reason for Consultation/Follow-up: Establishing goals of care  Subjective:   Patient wakes to voice but complains of pain. He does remember he is at the hospital. He is on 15L NRB.   Discussed plan of care with Dr. Anselm Jungling. Waiting on oncology f/u this afternoon (Dr. Tasia Catchings) who plans to evaluate and further discuss with family. Dr. Anselm Jungling is agreeable with initiation of prn dilaudid for pain management.   ADDENDUM 1610: Discussed with care team. Patient continues to moan this afternoon despite prn dilaudid doses. Chart reviewed. Patient is DNR/DNI but family wishes to continue current plan of care, treat the treatable, and watchful waiting. Discussed with Dr. Anselm Jungling again. Will change Dilaudid 1mg  q3h prn pain. Prn Haldol on board.   Length of Stay: 2  Current Medications: Scheduled Meds:  . chlorhexidine  15 mL Mouth Rinse BID  . doxycycline  100 mg Oral Q12H  . escitalopram  10 mg Oral Daily  . heparin  5,000 Units Subcutaneous Q8H  . mouth rinse  15 mL Mouth Rinse q12n4p  . Melatonin  1 tablet Oral QHS  . multivitamin with minerals  1 tablet Oral Daily  . sodium chloride flush  3 mL Intravenous Once  . sodium polystyrene  60 g Rectal Once  . thiamine  100 mg Oral Daily    Continuous Infusions: . sodium chloride 100 mL/hr at 05/29/18 0022  . cefTRIAXone (ROCEPHIN)  IV Stopped (05/28/18 1301)    PRN Meds: acetaminophen **OR** acetaminophen, morphine injection, oxyCODONE  Physical Exam Vitals signs and nursing note reviewed.  Constitutional:       Appearance: He is cachectic. He is ill-appearing.  HENT:     Head: Normocephalic and atraumatic.  Pulmonary:     Effort: No tachypnea, accessory muscle usage or respiratory distress.     Comments: NRB 15L  Skin:    General: Skin is warm and dry.  Neurological:     Mental Status: He is easily aroused.     Comments: Wakes to voice, ? Orientation.            Vital Signs:  BP 108/78 (BP Location: Right Arm)   Pulse 94   Temp 97.9 F (36.6 C)   Resp 18   SpO2 (!) 84%  SpO2: SpO2: (!) 84 % O2 Device: O2 Device: High Flow Nasal Cannula O2 Flow Rate: O2 Flow Rate (L/min): 15 L/min  Intake/output summary:   Intake/Output Summary (Last 24 hours) at 05/29/2018 1022 Last data filed at 05/29/2018 0905 Gross per 24 hour  Intake 1258.37 ml  Output 250 ml  Net 1008.37 ml   LBM:   Baseline Weight:   Most recent weight:         Palliative Assessment/Data: PPS 30%    Flowsheet Rows     Most Recent Value  Intake Tab  Referral Department  Hospitalist  Unit at Time of Referral  ER  Palliative Care Primary Diagnosis  Cancer  Date Notified  06/08/2018  Palliative Care Type  New Palliative care  Reason for referral  Clarify Goals of Care  Date of Admission  05/29/2018  # of days IP prior to Palliative referral  0  Clinical Assessment  Psychosocial & Spiritual Assessment  Palliative Care Outcomes      Patient Active Problem List   Diagnosis Date Noted  . Metabolic encephalopathy   . Pneumonia 06/21/2018  . Palliative care encounter 05/24/2018  . Weakness generalized 05/24/2018  . Anorexic 05/24/2018  . Normocytic anemia 05/24/2018  . AKI (acute kidney injury) ([place]) 05/23/2018  . Renal cell carcinoma of left kidney ([place]) 05/16/2018  . Metastatic renal cell carcinoma ([place]) 05/11/2018  . Goals of care, counseling/discussion 04/28/2018  . 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 Traumatic brain injury ([place]) 11/25/2016  . Delirium 11/18/2016   . Hypercalcemia 10/26/2016  . Hypernatremia 10/26/2016  . Prolonged QT interval 10/26/2016  . Hypertension 10/14/2016  . Chronic hepatitis C virus infection ([place]) 11/16/2014  . 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 Patient is a resident at [facility] due to previous TBI. He is wheelchair bound at baseline.  Patient was admitted to the hospital on 05/29/2018 with altered mental status and acute hypoxic respiratory failure.  Chest x-ray revealed left-sided consolidation with probable pneumonia.  Abdominal CT revealed disease progression with increased size of the large soft tissue mass in the left renal hilum and retroperitoneal lymphadenopathy.  Palliative care was consulted to help provide supportive care.  Assessment: Renal cell carcinoma with mets to lung Acute hypoxic respiratory failure  Pneumonia AKI on CKD Hx of TBI Dysphagia Hyperkalemia  Recommendations/Plan:  DNR/DNI. Continue current plan of care and watchful waiting. Oncology following and discussed with family today.   Dilaudid 1mg  IV q3h prn pain/dyspnea  Haldol 1-2mg  IV q6h prn agitation  PMT will follow  Code Status: DNR/DNI   Code Status Orders  (From admission, onward)         Start     Ordered   05/28/18 1726  Do not attempt resuscitation (DNR)  Continuous    Question Answer Comment  In the event of cardiac or respiratory ARREST Do not call a "code blue"   In the event of cardiac or respiratory ARREST Do not perform Intubation, CPR, defibrillation or ACLS   In the event of cardiac or respiratory ARREST Use medication by any route, position, wound care,  and other measures to relive pain and suffering. May use oxygen,  suction and manual treatment of airway obstruction as needed for comfort.      05/28/18 1725        Code Status History    Date Active Date Inactive Code Status Order ID Comments User Context   06/08/2018 1358 05/28/2018 1725 Full Code [id number]  Loletha Grayer, MD ED       Prognosis:   Poor prognosis  Discharge Planning:  To Be Determined   Care plan was discussed with patient, RN, SW, Dr. Anselm Jungling, Billey Chang NP  Thank you for allowing the Palliative Medicine Team to assist in the care of this patient.   Time In: 1000- 1600- Time Out: 1020 1610 Total Time 30 Prolonged Time Billed  no      Greater than 50%  of this time was spent counseling and coordinating care related to the above assessment and plan.  Ihor Dow, FNP-C Palliative Medicine Team  Phone: [phone] Fax: [phone]  Please contact Palliative Medicine Team phone at [phone] for questions and concerns.

## 2018-05-29 NOTE — Progress Notes (Signed)
SLP Cancellation Note  Patient Details Name: Keith Daniels MRN: [id number] DOB: 01/17/1951   Cancelled treatment:       Reason Eval/Treat Not Completed: Patient not medically ready;Medical issues which prohibited therapy(chart reviewed). Pt is currently having pain ("all over") and moaning requiring Dilaudid. Pt remains NPO. ST services will f/u next 1-2 days w/ pt's status and appropriateness for safe oral intake. Recommend oral care as often as possible; aspiration precautions.    Orinda Kenner, MS, CCC-SLP Samarie Pinder 05/29/2018, 9:59 AM

## 2018-05-29 NOTE — Progress Notes (Signed)
*  PRELIMINARY RESULTS* Echocardiogram 2D Echocardiogram has been performed.  Keith Daniels 05/29/2018, 10:01 AM

## 2018-05-29 NOTE — Consult Note (Signed)
Pharmacy Antibiotic Note  Keith Daniels is a 68 y.o. male admitted on 06/25/2018 with pneumonia(hcap)  Pharmacy has been consulted for Cefepime dosing.  Plan: Cefepime 1g q12hours (Consider increasing dosing if Scr improves towards baseline)   Temp (24hrs), Avg:97.8 F (36.6 C), Min:97.7 F (36.5 C), Max:97.9 F (36.6 C)  Recent Labs  Lab 05/23/18 1345 06/23/2018 1041 05/29/2018 2043 05/28/18 0751 05/28/18 1753 05/29/18 0337 05/29/18 1132  WBC 2.9* 7.0  --  6.4 7.6  --   --   CREATININE 1.27* 1.72* 1.59* 1.99* 2.41* 2.56* 2.31*    Estimated Creatinine Clearance: 27.9 mL/min (A) (by C-G formula based on SCr of 2.31 mg/dL (H)).    No Known Allergies  Antimicrobials this admission: Azithromycin 3/1 >> 3/1 CTX 3/1 >> 3/2 Doxycycline 3/2 >> 3/2 Cefepime 3/3 >>  Dose adjustments this admission: N/A  Microbiology results: 3/1 BCx: NG x 2 days  3/2 MRSA PCR: NEG  Thank you for allowing pharmacy to be a part of this patient's care.  Lu Duffel, PharmD, BCPS Clinical Pharmacist 05/29/2018 1:21 PM

## 2018-05-29 NOTE — Consult Note (Signed)
[facility] Consultation      Assessment and Plan:  Acute hypoxic respiratory failure Pneumonia-severe. Metastatic renal cell cancer with likely pulmonary metastasis. Acute on chronic kidney disease. Dysphagia with high risk of aspiration. Altered mental status. DNR/DNI.  - Case discussed with hospitalist physician.  Currently the patient's clinical status appears poor, he is on nonrebreather with oxygen saturation of 89%.  I have asked nurse to tolerate oxygen saturation of greater than 85%.  He was tried briefly on high flow nasal cannula but had desats. - Patient is on appropriate therapy for pneumonia.  MRSA screen negative. - Acute kidney injury, possible patient may be having some degree of pulmonary edema contributing to respiratory failure. - Prognosis appears very poor at this point, patient is a high risk of further decline, death.  At this point would recommend transition to comfort measures, agree with continued DNR/DNI status.  Currently the family is in discussions regarding patient's goals of care, and palliative care is following. - We will give empiric Lasix and IV steroids to see if this may provide any symptomatic benefit.   Date: 05/29/2018  MRN# [id number] Hosey Burmester 1950/10/04  Referring Physician: Dr. Anselm Jungling for respiratory failure.   Danzig Kaus is a 68 y.o. old male seen in consultation for chief complaint of:    Chief Complaint  Patient presents with  . Altered Mental Status    HPI:  Patient is a 68 y.o. male with history of stage IV renal cell carcinoma who just started anti-neoplasm treatment proximately 2 weeks ago, history of traumatic brain injury, long-term facility resident, hepatitis C hypertension, history of alcohol abuse and IV drug abuse currently admitted due to altered mental status, acute respiratory failure. Patient presented from [place] on 05/29/2018 with respiratory failure with oxygen saturation in the  80s and increased work of breathing with confusion.  Over the course of the patient's hospitalization, mental status, respiratory status and swallowing ability have progressively declined.  Swallow eval was attempted on 3/2, he was thought to be a severe aspiration risk, recommended n.p.o. status. Currently patient cannot provide history, he is nonverbal, his only vocalization is moaning upon any physical or verbal stimulation.  He is status post chemotherapy however repeat CT abdomen showed continued growth of the tumor. Patient was initially on nasal cannula, however yesterday the patient's oxygen saturation dropped further and he was started on nonrebreather mask.  He was tried earlier this morning on high flow nasal cannula but continued to desat, therefore changed back to nonrebreather mask, current sat of 89%. Patient is currently DNR/DNI.   Chest x-ray 05/28/2018>> imaging personally reviewed, cardiomegaly with bibasilar infiltrates versus atelectasis.  Review of CT chest from 05/03/2018 shows predominantly upper lobe emphysema with bullae, multiple bilateral nodules suspicious for metastatic renal cancer.  PMHX:   Past Medical History:  Diagnosis Date  . Alcohol dependence in remission ([place])   . Chewing tobacco nicotine dependence, uncomplicated   . Chronic viral hepatitis C ([place])   . Cognitive communication deficit   . Diffuse traumatic brain injury ([place])   . Dysphagia, oropharyngeal phase   . Essential hypertension   . Extended spectrum beta lactamase (ESBL) resistance   . Gross hematuria   . History of traumatic brain injury   . Major depressive disorder   . Metastatic renal cell carcinoma ([place]) 05/11/2018  . Muscle weakness (generalized)   . Unspecified severe protein-calorie malnutrition ([place])   . Urinary tract infection, site not specified    Surgical Hx:  Past Surgical History:  Procedure Laterality Date  . TESTICLE REMOVAL     Family Hx:  Family History  Problem Relation  Age of Onset  . Kidney disease Mother   . Pneumonia Father   . Hypertension Brother   . Prostate cancer Brother    Social Hx:   Social History   Tobacco Use  . Smoking status: Former Smoker    Packs/day: 1.00    Years: 10.00    Pack years: 10.00    Types: Cigarettes    Last attempt to quit: 04/03/2017    Years since quitting: 1.1  . Smokeless tobacco: Never Used  Substance Use Topics  . Alcohol use: Not Currently    Comment: quit in 2019  . Drug use: Not Currently    Types: "Crack" cocaine    Comment: quit in 2019   Medication:    Current Facility-Administered Medications:  .  0.9 %  sodium chloride infusion, , Intravenous, Continuous, Vaughan Basta, MD, Last Rate: 100 mL/hr at 05/29/18 1026 .  acetaminophen (TYLENOL) tablet 650 mg, 650 mg, Oral, Q6H PRN **OR** acetaminophen (TYLENOL) suppository 650 mg, 650 mg, Rectal, Q6H PRN, Wieting, Richard, MD .  ceFEPIme (MAXIPIME) 1 g in sodium chloride 0.9 % 100 mL IVPB, 1 g, Intravenous, Q12H, Shanlever, [name], RPH .  chlorhexidine (PERIDEX) 0.12 % solution 15 mL, 15 mL, Mouth Rinse, BID, Vaughan Basta, MD, 15 mL at 05/28/18 1636 .  haloperidol lactate (HALDOL) injection 1-2 mg, 1-2 mg, Intravenous, Q6H PRN, Borders, Kirt Boys, NP .  heparin injection 5,000 Units, 5,000 Units, Subcutaneous, Q8H, Loletha Grayer, MD, 5,000 Units at 05/29/18 6712 .  HYDROmorphone (DILAUDID) injection 1 mg, 1 mg, Intravenous, Q4H PRN, Vaughan Basta, MD .  MEDLINE mouth rinse, 15 mL, Mouth Rinse, q12n4p, Vaughan Basta, MD, 15 mL at 05/28/18 1641 .  sodium chloride flush (NS) 0.9 % injection 3 mL, 3 mL, Intravenous, Once, Wieting, Richard, MD .  sodium polystyrene (KAYEXALATE) 15 GM/60ML suspension 60 g, 60 g, Rectal, Once, Vaughan Basta, MD   Allergies:  Patient has no known allergies.  Review of Systems: Could not be obtained due to patient is nonverbal.  Physical Examination:   VS: BP 111/70 (BP  Location: Right Arm)   Pulse 89   Temp 97.9 F (36.6 C)   Resp 19   SpO2 (!) 89%   General Appearance: Appeared dyspneic. Neuro:without focal findings,  speech unintelligible. HEENT: PERRLA, EOM intact.   Pulmonary: Decreased air entry bilaterally. CardiovascularNormal S1,S2.  No m/r/g.   Abdomen: Benign, Soft, non-tender. Renal:  No costovertebral tenderness  GU:  No performed at this time. Endoc: No evident thyromegaly, no signs of acromegaly. Skin:   warm, no rashes, no ecchymosis  Extremities: normal, no cyanosis, clubbing.  Other findings:    LABORATORY PANEL:   CBC Recent Labs  Lab 05/28/18 1753  WBC 7.6  HGB 10.3*  HCT 33.7*  PLT 207   ------------------------------------------------------------------------------------------------------------------  Chemistries  Recent Labs  Lab 05/28/18 1753  05/29/18 1132  NA 145   < > 149*  K 5.1   < > 5.4*  CL 114*   < > 117*  CO2 19*   < > 23  GLUCOSE 104*   < > 88  BUN 56*   < > 60*  CREATININE 2.41*   < > 2.31*  CALCIUM 8.2*   < > 8.5*  AST 63*  --   --   ALT 106*  --   --  ALKPHOS 55  --   --   BILITOT 0.7  --   --    < > = values in this interval not displayed.   ------------------------------------------------------------------------------------------------------------------  Cardiac Enzymes Recent Labs  Lab 05/28/18 1753  TROPONINI 0.28*   ------------------------------------------------------------  RADIOLOGY:  Ct Abdomen Pelvis Wo Contrast  Result Date: 06/15/2018 CLINICAL DATA:  Diffuse abdominal pain. Metastatic renal cell carcinoma. EXAM: CT ABDOMEN AND PELVIS WITHOUT CONTRAST TECHNIQUE: Multidetector CT imaging of the abdomen and pelvis was performed following the standard protocol without IV contrast. COMPARISON:  03/09/2018 FINDINGS: Lower chest: Increased cardiomegaly. New small right pleural effusion and mild dependent atelectasis. New infiltrate or atelectasis in the posterior left lower  lobe. Hepatobiliary: No mass visualized on this unenhanced exam. Gallbladder is unremarkable. Pancreas: No mass or inflammatory process visualized on this unenhanced exam. Spleen:  Within normal limits in size. Adrenals/Urinary tract: Large soft tissue mass is again seen in the left renal hilum and collecting system which measures 5.8 x 5.1 cm. This is increased in size from 4.7 x 3.9 cm on prior study. Stomach/Bowel: No evidence of obstruction, inflammatory process, or abnormal fluid collections. Diverticulosis is seen mainly involving the sigmoid colon, however there is no evidence of diverticulitis. Vascular/Lymphatic: Increased size of soft tissue mass or lymphadenopathy is seen in the left paraaortic region and involving the left psoas muscle. This is also increased in size currently measuring 8.1 x 5.7 cm compared to 5.4 x 3.8 cm previously. Increased size of aortic oval lymph node is also seen which currently measures 1.6 cm compared to 0.7 cm previously. No pelvic lymphadenopathy identified. No evidence of abdominal aortic aneurysm. Reproductive:  No mass or other significant abnormality. Other: New mild ascites and diffuse mesenteric edema compared to prior exam. Musculoskeletal:  No suspicious bone lesions identified. IMPRESSION: 1. Increased size of large soft tissue mass involving the left renal hilum and collecting system. 2. Increased size of retroperitoneal lymphadenopathy, consistent with metastatic disease. 3. New mild ascites and diffuse mesenteric edema. 4. New small right pleural effusion and left lower lobe infiltrate versus atelectasis. 5. Colonic diverticulosis. No radiographic evidence of diverticulitis. Electronically Signed   By: Earle Gell M.D.   On: 05/29/2018 15:25   Dg Chest 1 View  Result Date: 05/29/2018 CLINICAL DATA:  Altered mental status EXAM: CHEST  1 VIEW COMPARISON:  05/28/2018 FINDINGS: Cardiomegaly. Left lower lobe airspace opacity persists, unchanged. Small bilateral  pleural effusions suspected. Mild vascular congestion. No real change. IMPRESSION: No significant change since prior study. Electronically Signed   By: Rolm Baptise M.D.   On: 05/29/2018 09:48   Dg Chest 1 View  Result Date: 05/28/2018 CLINICAL DATA:  Hypotensive. History of metastatic renal cell carcinoma and pneumonia. EXAM: CHEST  1 VIEW COMPARISON:  Chest radiograph May 27, 2018 FINDINGS: Persistent retrocardiac consolidation with air bronchograms. Small pleural effusion. Hazy density RIGHT lung base. Cardiac silhouette is mildly enlarged. Mediastinal silhouette is not suspicious. Apical bullous changes. No pneumothorax. Soft tissue planes and included osseous structures are unchanged. IMPRESSION: 1. Persistent retrocardiac consolidation and volume loss. Small pleural effusions. 2. Stable cardiomegaly. Electronically Signed   By: Elon Alas M.D.   On: 05/28/2018 18:33   Ct Head Wo Contrast  Result Date: 05/28/2018 CLINICAL DATA:  Altered mental status. Metastatic renal cell carcinoma. EXAM: CT HEAD WITHOUT CONTRAST TECHNIQUE: Contiguous axial images were obtained from the base of the skull through the vertex without intravenous contrast. COMPARISON:  None. FINDINGS: Brain: There is no mass, hemorrhage  or extra-axial collection. There is generalized atrophy without lobar predilection. Hypodensity of the white matter is most commonly associated with chronic microvascular disease. Vascular: No abnormal hyperdensity of the major intracranial arteries or dural venous sinuses. No intracranial atherosclerosis. Skull: The visualized skull base, calvarium and extracranial soft tissues are normal. Sinuses/Orbits: No fluid levels or advanced mucosal thickening of the visualized paranasal sinuses. No mastoid or middle ear effusion. The orbits are normal. IMPRESSION: 1. No acute intracranial abnormality. 2. Generalized atrophy and findings of chronic microvascular ischemia. Electronically Signed   By: Ulyses Jarred M.D.   On: 05/28/2018 18:19       Thank  you for the consultation and for allowing [facility] to assist in the care of your patient. Our recommendations are noted above.  Please contact us if we can be of further service.   Marda Stalker, M.D., F.C.C.P.  Board Certified in Internal Medicine, Pulmonary Medicine, [place], and Sleep Medicine.  [facility] Office Number: [phone]   05/29/2018

## 2018-05-30 ENCOUNTER — Inpatient Hospital Stay: Payer: Medicare Other

## 2018-05-30 ENCOUNTER — Encounter: Payer: Self-pay | Admitting: Oncology

## 2018-05-30 ENCOUNTER — Inpatient Hospital Stay: Payer: Medicare Other | Admitting: Oncology

## 2018-05-30 LAB — CBC
HCT: 33.3 % — ABNORMAL LOW (ref 39.0–52.0)
Hemoglobin: 10.3 g/dL — ABNORMAL LOW (ref 13.0–17.0)
MCH: 27.3 pg (ref 26.0–34.0)
MCHC: 30.9 g/dL (ref 30.0–36.0)
MCV: 88.3 fL (ref 80.0–100.0)
NRBC: 1.4 % — AB (ref 0.0–0.2)
Platelets: 226 10*3/uL (ref 150–400)
RBC: 3.77 MIL/uL — ABNORMAL LOW (ref 4.22–5.81)
RDW: 16.5 % — ABNORMAL HIGH (ref 11.5–15.5)
WBC: 10.5 10*3/uL (ref 4.0–10.5)

## 2018-05-30 LAB — COMPREHENSIVE METABOLIC PANEL
ALT: 92 U/L — ABNORMAL HIGH (ref 0–44)
AST: 42 U/L — ABNORMAL HIGH (ref 15–41)
Albumin: 2.8 g/dL — ABNORMAL LOW (ref 3.5–5.0)
Alkaline Phosphatase: 54 U/L (ref 38–126)
Anion gap: 11 (ref 5–15)
BUN: 63 mg/dL — ABNORMAL HIGH (ref 8–23)
CO2: 19 mmol/L — ABNORMAL LOW (ref 22–32)
CREATININE: 2.36 mg/dL — AB (ref 0.61–1.24)
Calcium: 8.6 mg/dL — ABNORMAL LOW (ref 8.9–10.3)
Chloride: 120 mmol/L — ABNORMAL HIGH (ref 98–111)
GFR calc Af Amer: 32 mL/min — ABNORMAL LOW (ref >60)
GFR calc non Af Amer: 28 mL/min — ABNORMAL LOW (ref >60)
Glucose, Bld: 133 mg/dL — ABNORMAL HIGH (ref 70–99)
Potassium: 5.8 mmol/L — ABNORMAL HIGH (ref 3.5–5.1)
Sodium: 150 mmol/L — ABNORMAL HIGH (ref 135–145)
Total Bilirubin: 0.8 mg/dL (ref 0.3–1.2)
Total Protein: 6.6 g/dL (ref 6.5–8.1)

## 2018-05-30 LAB — PHOSPHORUS: Phosphorus: 3.6 mg/dL (ref 2.5–4.6)

## 2018-05-30 LAB — GLUCOSE, CAPILLARY: Glucose-Capillary: 134 mg/dL — ABNORMAL HIGH (ref 70–99)

## 2018-05-30 MED ORDER — DEXTROSE 5 % IV SOLN: INTRAVENOUS | Status: DC

## 2018-05-31 LAB — ECHOCARDIOGRAM COMPLETE

## 2018-06-01 LAB — CULTURE, BLOOD (ROUTINE X 2)
Culture: NO GROWTH
Culture: NO GROWTH
Specimen Description: ADEQUATE
Specimen Description: ADEQUATE

## 2018-06-05 ENCOUNTER — Encounter: Payer: Self-pay | Admitting: Oncology

## 2018-06-06 ENCOUNTER — Ambulatory Visit: Payer: Medicare Other

## 2018-06-06 ENCOUNTER — Ambulatory Visit: Payer: Medicare Other | Admitting: Oncology

## 2018-06-06 ENCOUNTER — Other Ambulatory Visit: Payer: Medicare Other

## 2018-06-21 ENCOUNTER — Ambulatory Visit: Payer: Medicare Other | Admitting: Hospice and Palliative Medicine

## 2018-06-27 NOTE — Progress Notes (Signed)
In room with patient, RN Deyna Carbon and Elita Quick, nursing student and chaplain. Patient time of death 61. Pronounced by Dr Doylene Canning. Family notified. Brother Franki Stemen and son Tryson Lumley. AC notified.  Patient post- mortem care performed. Post mortem flowsheet completed. [facility] contacted. Per Mr Lavone Nian, patient is not a candidate. Charge RN [place] contacted family. They have chosen to use [facility] in [place], [place]. Family en-route to hospital from [place].

## 2018-06-27 NOTE — Discharge Summary (Signed)
Date of death-May 30, 2018  Cause of death-stage IV renal cell carcinoma  Contributing factors-pneumonia, acute renal failure, acute respiratory failure  Brief hospital course  1. Acute hypoxic respiratory failure with a pulse ox of 88% on nonrebreather mask. Oxygen supplementation He could not tolerate high flow nasal cannula. Chest x-ray does not show new significant changes, this could be due to underlying cancer and pneumonia.  I have called pulmonary consult for further help. 2. Pneumonia. IV Rocephin and Zithromax .  Change to cefepime due to worsening condition.  MRSA negative.  3. 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 Patient with abdominal pain.     His cancer load is worse on CT scan Called oncology for consult and goals of care discussion with patient's family. 4. History of traumatic brain injury and bedbound 5. Severe malnutrition 6. Acute kidney injury on chronic kidney disease. Gentle IV fluid hydration.    Also given Kayexalate enema, kidney function continued to get worse with minimal urine output so called nephrology consult. 7. Hyperkalemia secondary to acute kidney injury. Give IV fluids overnight.  Kayexalate enema was given. 8. Depression continue psychiatric medications 9.  Dysphagia-failed swallow evaluation.  Today morning I was called in the room around 945 because of patient's bradycardia and gasping breathing. When I saw patient was completely drowsy/comatose-with shallow and irregular, slow breathing like gasping.  His heart was irregular and slow and fluctuating with a very weak pulse.  Blood pressure was noted to be low.  Patient was not responding to painful stimuli at that time.  He has very dark-colored urine in the bag.  Nonrebreather mask in place.  I spoke to his brother about this new change in worsening in the condition.  He was understanding the situation and I had informed him about patient looking at the end of his  life. Within the next few minutes his heart slowly stop beating and he stopped breathing. No resuscitation attempted as patient was DNR/DNI.  Time spent today- 35 min.

## 2018-06-27 NOTE — Significant Event (Signed)
Rapid Response Event Note  Overview: Time Called: 0930 Arrival Time: 0932 Event Type: Cardiac, Respiratory, Hypotension, Other (Comment)  Initial Focused Assessment: Patient (DNR, DNI) actively dying.  Dr Fritzi Mandes at bedside, along with primary RN, RRT RN, [facility], RT, and Chaplain.  Patient with metastatic cancer, agonal breathing, obtunded, with VS dropping.  Patient on 100% NRB.  Family notified of patient's current passing and comfort measures ensured for patient.  No further need for RRT at this time.    Interventions: see below  Plan of Care (if not transferred):  Comfort measures, DNR-DNI  Event Summary: Name of Physician Notified: Dr Fritzi Mandes, Dr Anselm Jungling at 0930    at    Outcome: Code status clarified, Other (Comment)(patient actively dying; made comfortable)  Event End Time: [place]

## 2018-06-27 NOTE — Progress Notes (Signed)
Patient brother, Eoin Willden, contacted nurse. He stated that himself and the rest of the family will not be coming down to view the body in the room. They prefer for [facility] to pick him up and contact them once they have him.   Patient will be bagged and transferred to the morgue.

## 2018-06-27 NOTE — Clinical Social Work Note (Signed)
Patient's condition worsened this morning and CSW contacted patient's son and informed him of this. Patient's son was driving to [place], and has turned around to come to the hospital.  Patient passed away prior to son arriving. Shela Leff MSW,LCSW [phone]

## 2018-06-27 DEATH — deceased

## 2018-08-30 ENCOUNTER — Encounter: Payer: Self-pay | Admitting: Oncology

## 2020-02-02 IMAGING — CT CT BIOPSY
1 of 3 series · 12 of 32 positions shown, 18 images · non-contrast
Comparison: CT of the abdomen and pelvis on 03/09/2018

CLINICAL DATA: Hematuria with prior CT demonstrating soft tissue
mass occupying the left renal pelvis and collecting system and
associated probable metastatic lymphadenopathy in the left
retroperitoneum. The patient presents for CT-guided biopsy of the
left retroperitoneal lymph node mass.

EXAM:
CT GUIDED CORE BIOPSY OF LEFT RETROPERITONEAL MASS

[Series 2: i-spiral 5.0 b30f · axial · 0.63mm/px · z∈[+978,+1100]mm · 12 of 41 slices shown, 18 images]
[im 3/41  soft-tissue]
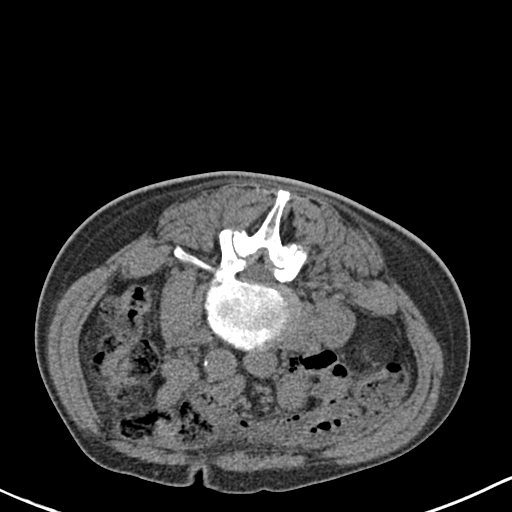
[im 3/41  bone]
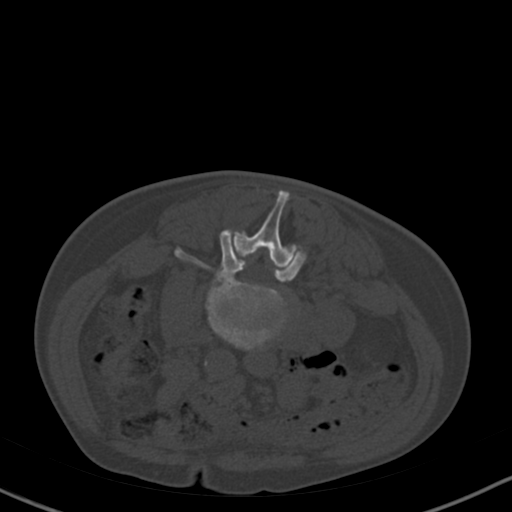
[im 6/41  soft-tissue]
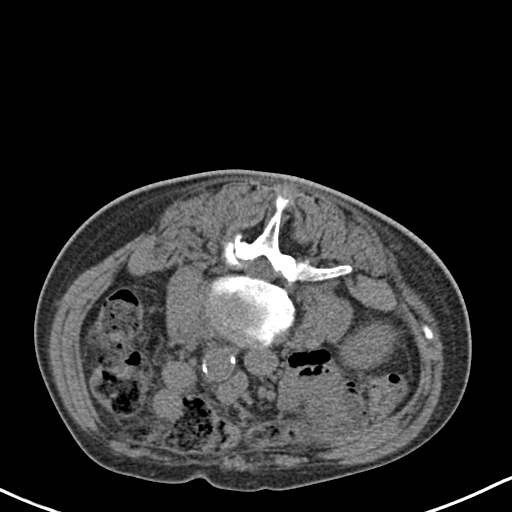
[im 9/41  soft-tissue]
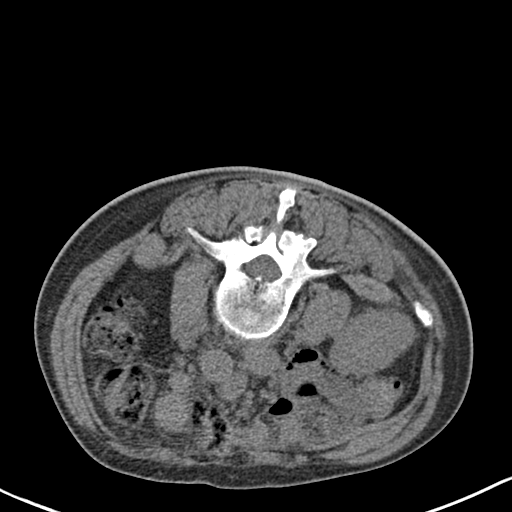
[im 12/41  soft-tissue]
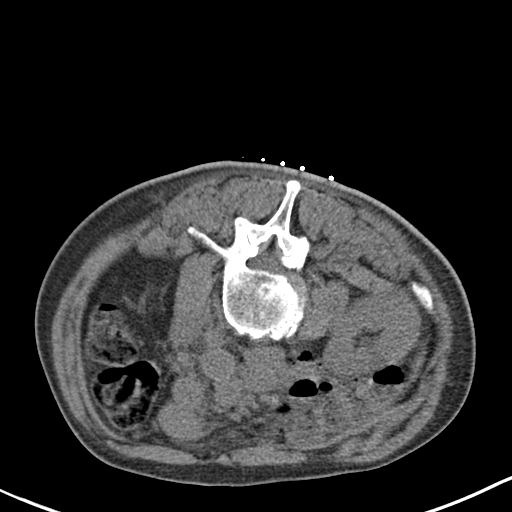
[im 15/41  soft-tissue]
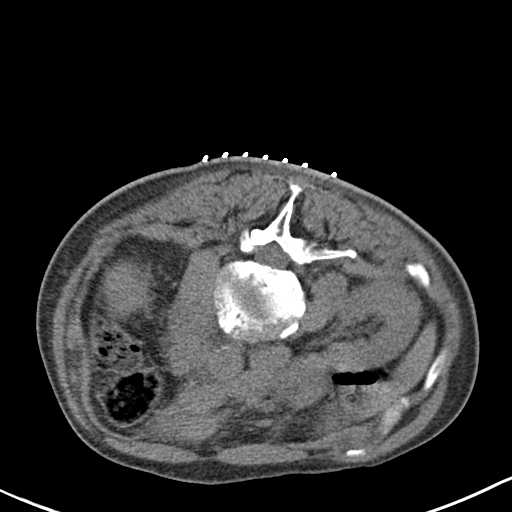
[im 18/41  soft-tissue]
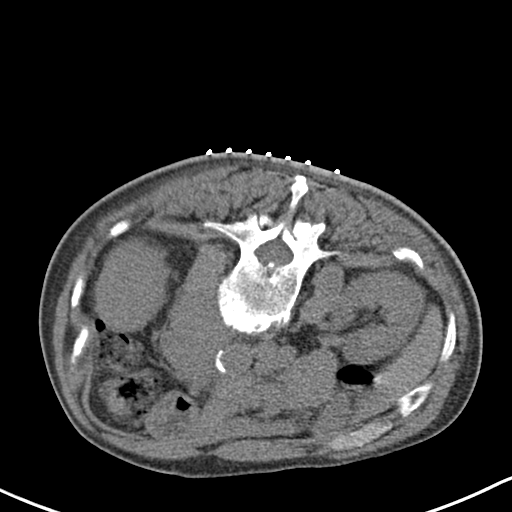
[im 23/41  soft-tissue]
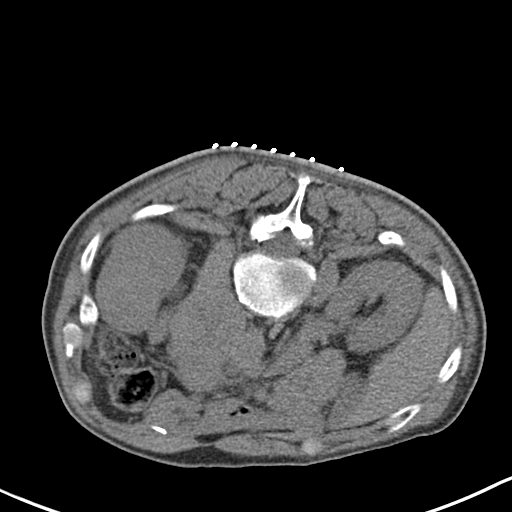
[im 26/41  soft-tissue]
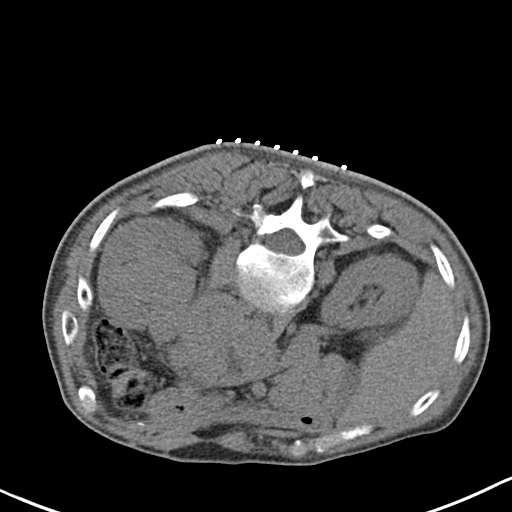
[im 29/41  soft-tissue]
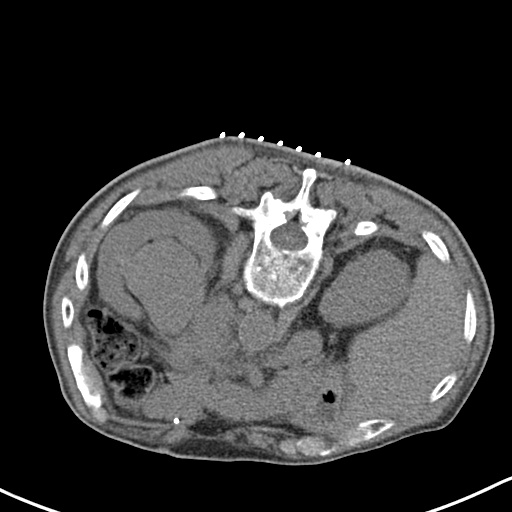
[im 29/41  lung]
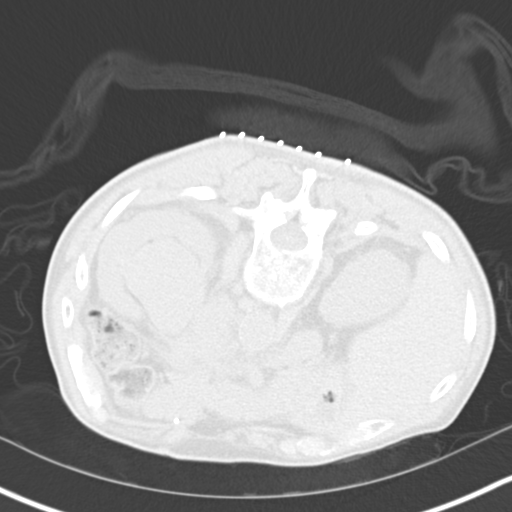
[im 29/41  bone]
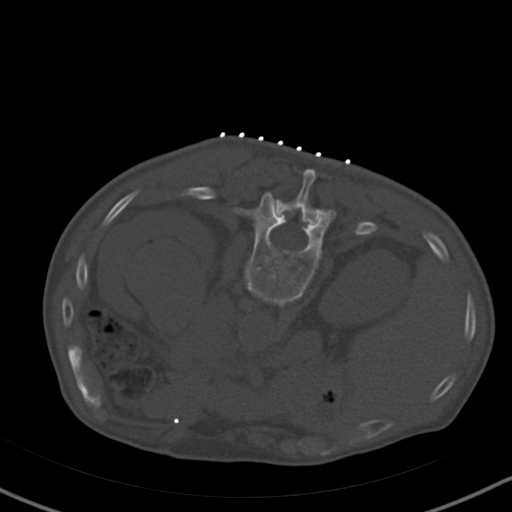
[im 32/41  soft-tissue]
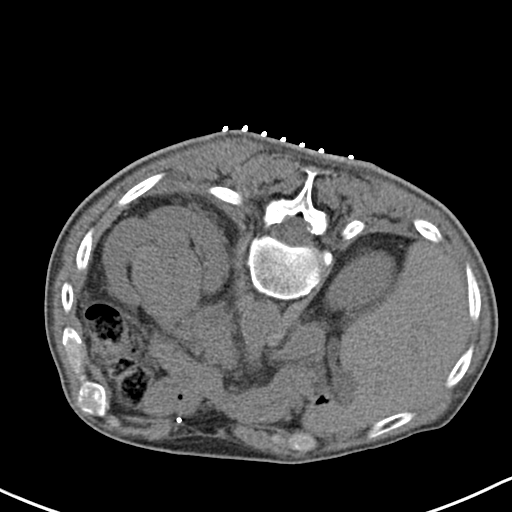
[im 32/41  lung]
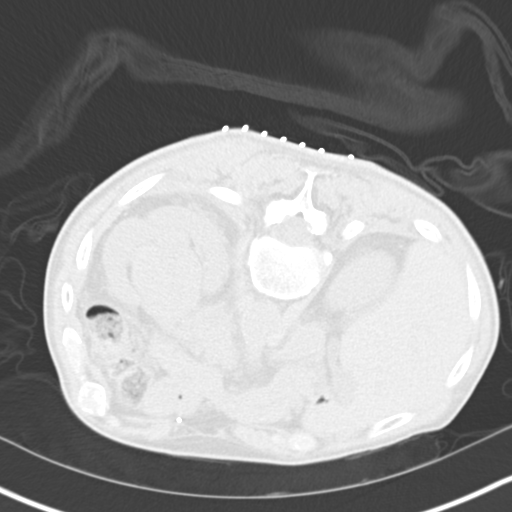
[im 35/41  soft-tissue]
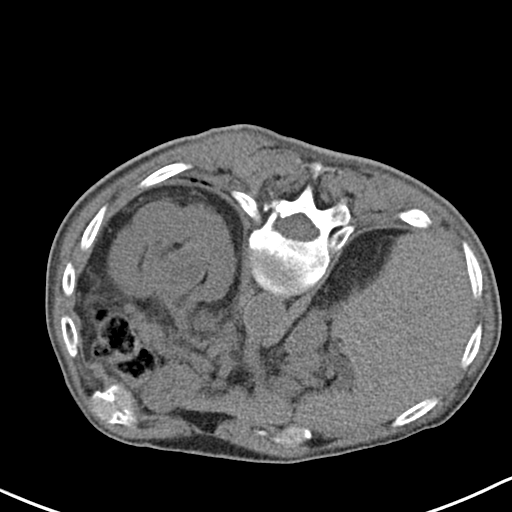
[im 35/41  lung]
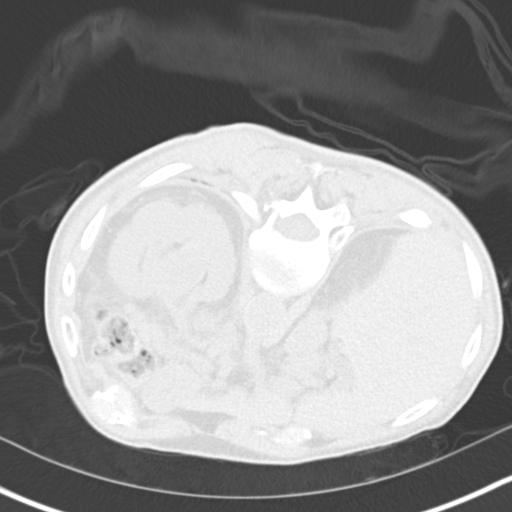
[im 38/41  soft-tissue]
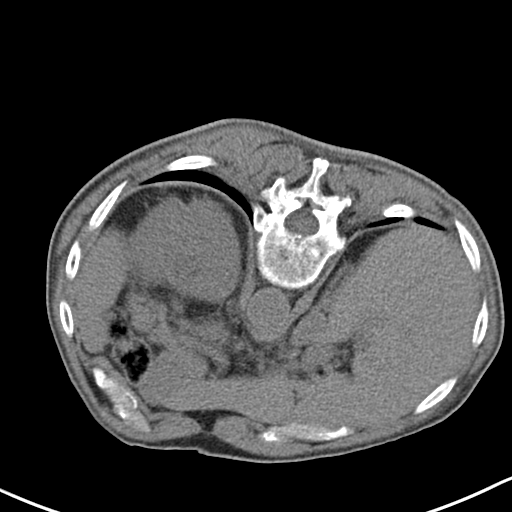
[im 38/41  lung]
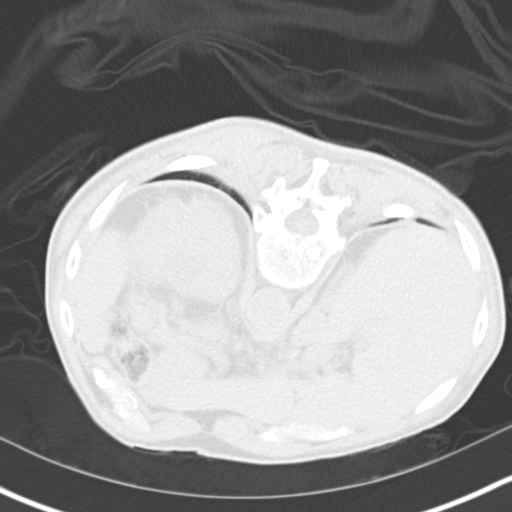

[12 of 32 positions shown; findings below may reference images not displayed]

ANESTHESIA/SEDATION:
3.0 mg IV Versed; 50 mcg IV Fentanyl

Total Moderate Sedation Time:  23 minutes.

The patient's level of consciousness and physiologic status were
continuously monitored during the procedure by Radiology nursing.

The patient was also given 10 mg IV hydralazine during the procedure
for hypertension.

PROCEDURE:
The procedure risks, benefits, and alternatives were explained to
the patient. Questions regarding the procedure were encouraged and
answered. The patient understands and consents to the procedure. A
time-out was performed prior to initiating the procedure.

CT was performed through the mid abdomen in a prone position. The
left translumbar region was prepped with chlorhexidine in a sterile
fashion, and a sterile drape was applied covering the operative
field. A sterile gown and sterile gloves were used for the
procedure. Local anesthesia was provided with 1% Lidocaine.

Under CT guidance, a 17 gauge trocar needle was advanced to the
level of a left retroperitoneal lymph node mass. After confirming
needle tip position, coaxial 18 gauge core biopsy samples were
obtained. Biopsy samples were submitted on saline soaked Telfa as
well as a single core sample in formalin. A total of 5 samples were
obtained.

COMPLICATIONS:
None
FINDINGS: Large left-sided retroperitoneal mass likely represents a
conglomerate lymph node mass and shows enlargement since prior CT
with maximal AP diameter just over 7 cm. Transverse width is
approximately 4.5 cm. There also is associated increased distension
of the left renal pelvis by tumor with transverse diameter of
cm.

Solid tissue was obtained from the lymph node mass. Some of the core
biopsy samples did appear necrotic. There were no immediate bleeding
complications.
IMPRESSION: CT-guided core biopsy performed of large left-sided retroperitoneal
lymph node mass. This mass shows enlargement since prior CT in
[REDACTED]. Distension of the left renal pelvis by tumor also appears
more prominent.

## 2020-02-19 IMAGING — CT CT CHEST W/ CM
1 series · 15 of 34 positions shown, 19 images · IV contrast (omnipaque)
Comparison: 03/09/2018 CT abdomen/pelvis.

CLINICAL DATA: Malignant left renal mass with left para-aortic
adenopathy, biopsy of which demonstrated "poorly differentiated
metastatic carcinoma, favor renal cell carcinoma" on 04/16/2018.
Chest staging.

EXAM:
CT CHEST WITH CONTRAST
TECHNIQUE: Multidetector CT imaging of the chest was performed during
intravenous contrast administration.
CONTRAST:  75mL OMNIPAQUE IOHEXOL 300 MG/ML  SOLN

[Series 2: axial st · axial · 0.66mm/px · z∈[-689,-387]mm · 15 of 179 slices shown, 19 images]
[im 14/179  mediastinal]
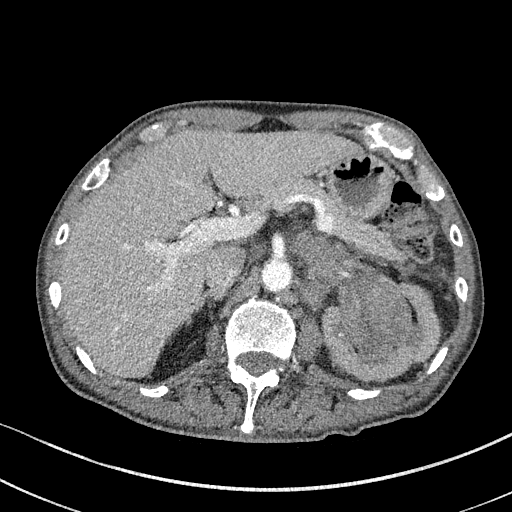
[im 14/179  lung]
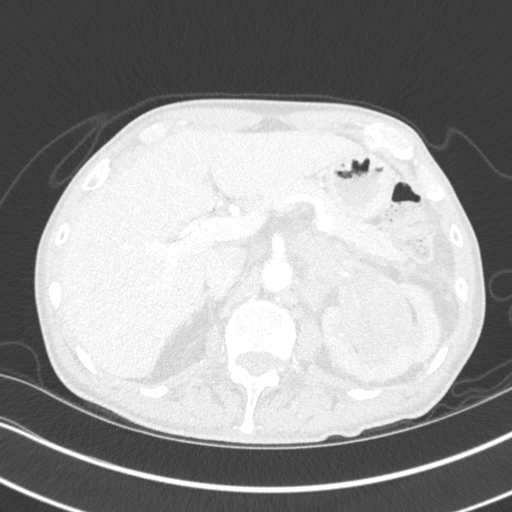
[im 27/179  lung]
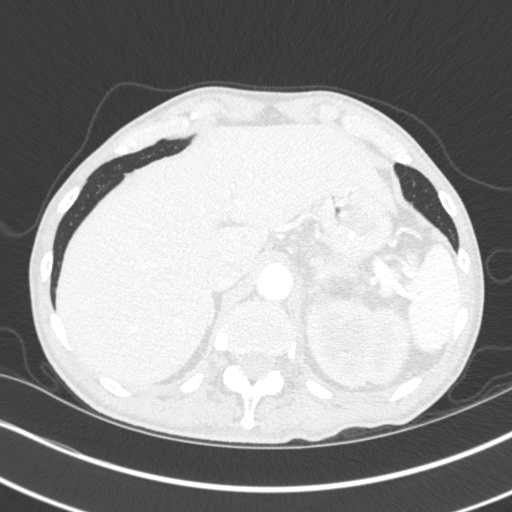
[im 36/179  lung]
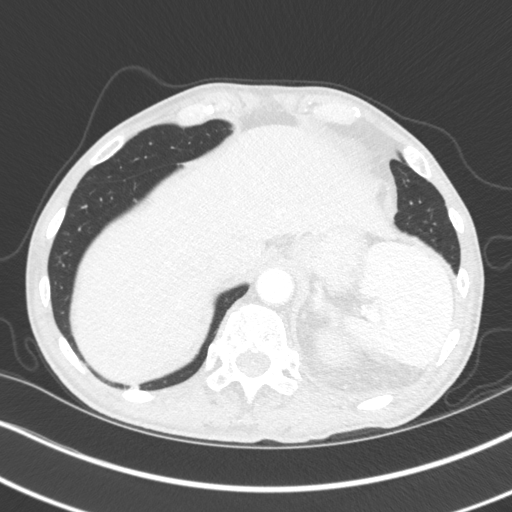
[im 47/179  lung]
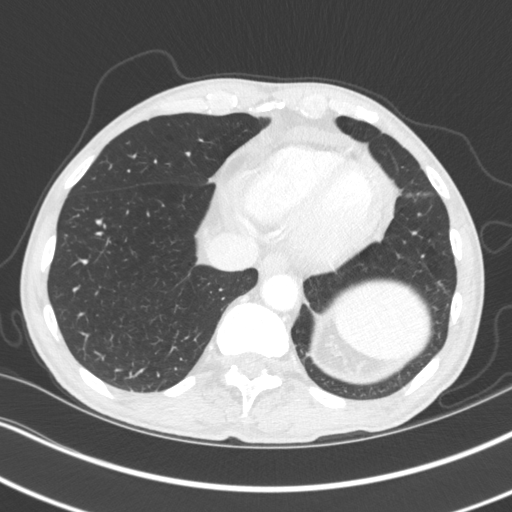
[im 60/179  mediastinal]
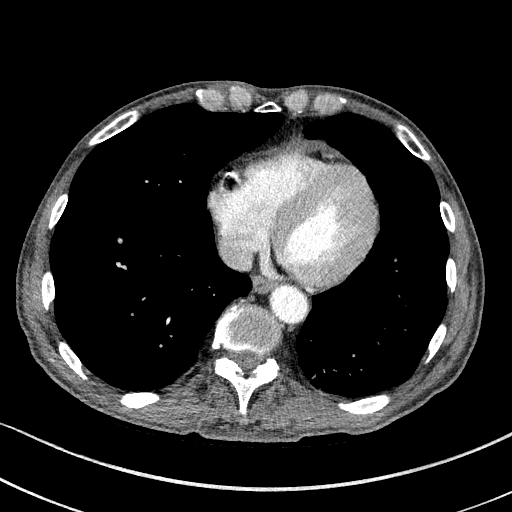
[im 60/179  lung]
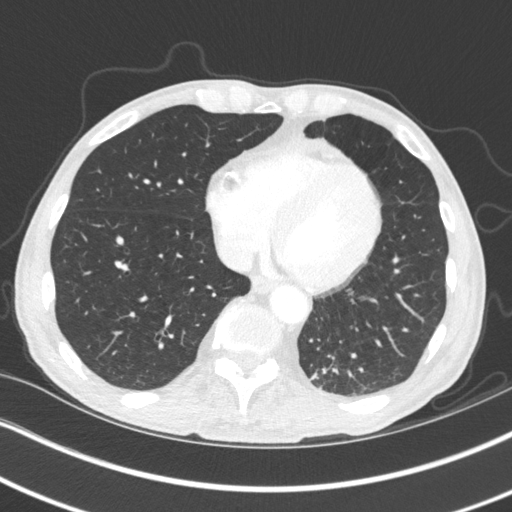
[im 72/179  lung]
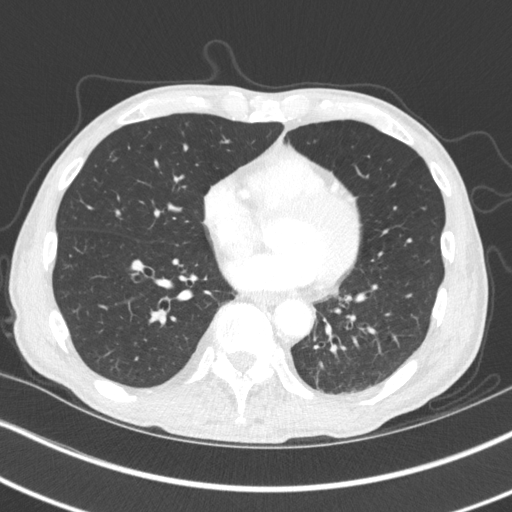
[im 80/179  lung]
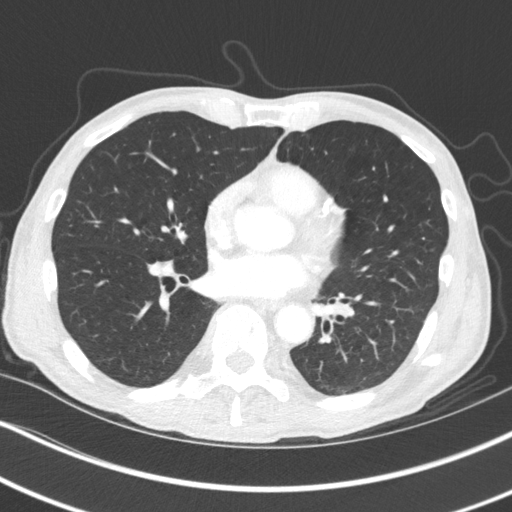
[im 93/179  lung]
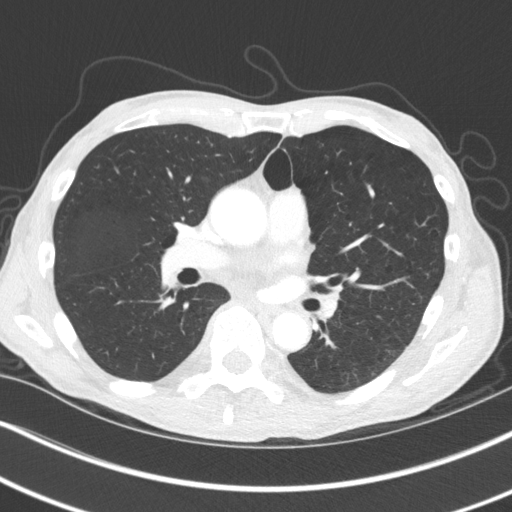
[im 99/179  mediastinal]
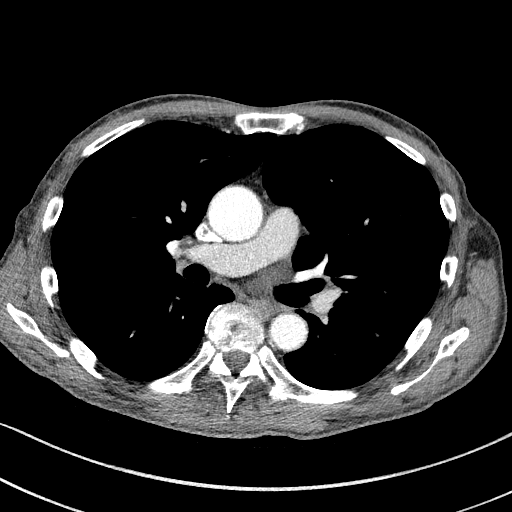
[im 99/179  lung]
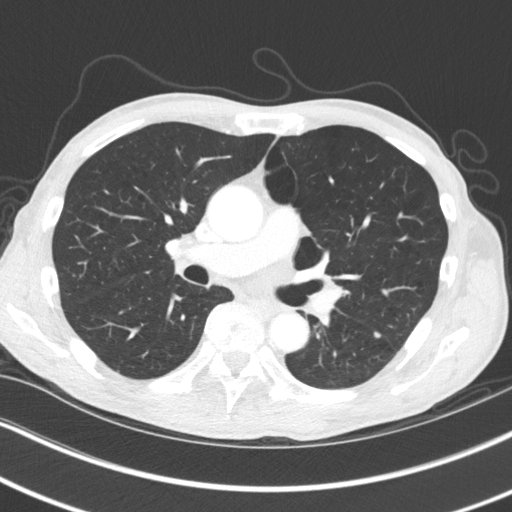
[im 107/179  lung]
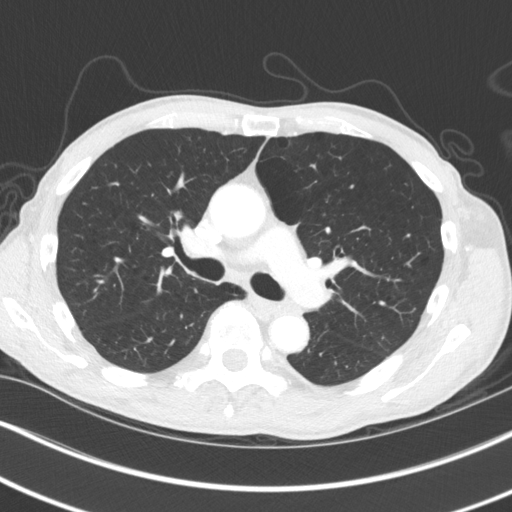
[im 119/179  lung]
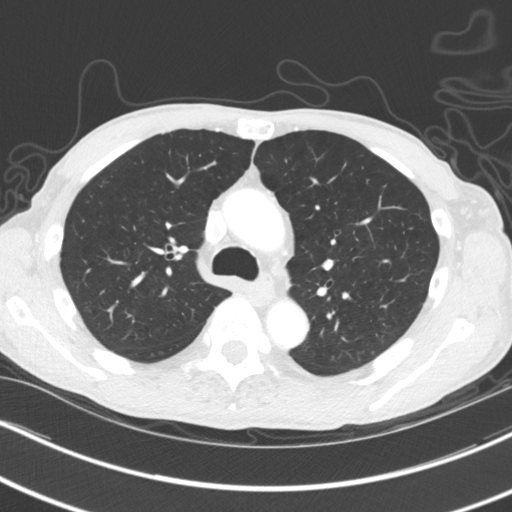
[im 132/179  lung]
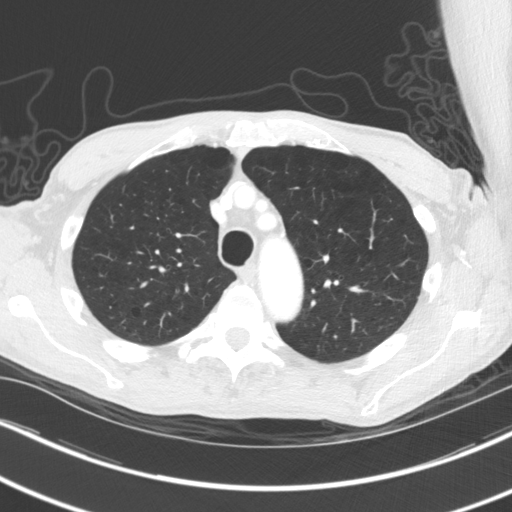
[im 143/179  mediastinal]
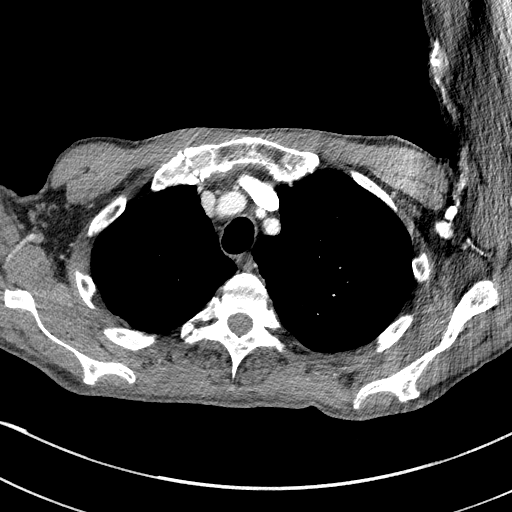
[im 143/179  lung]
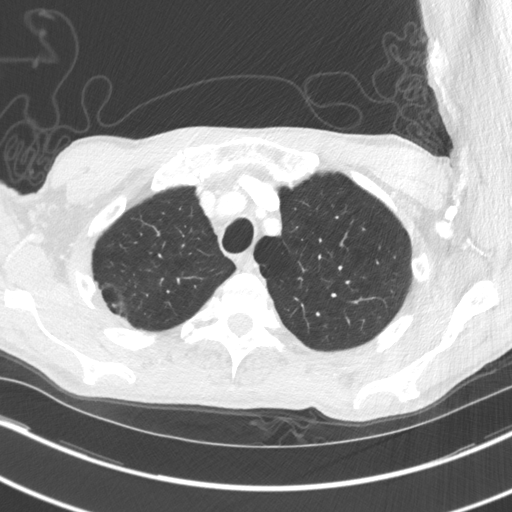
[im 152/179  lung]
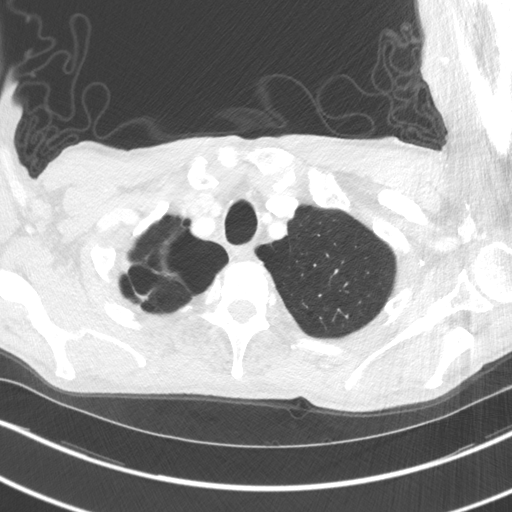
[im 165/179  lung]
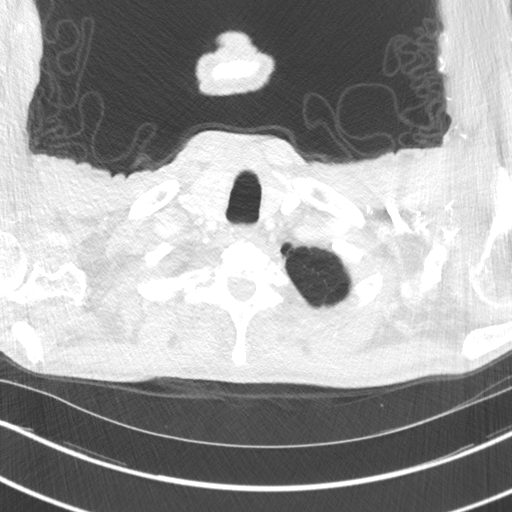

[15 of 34 positions shown; findings below may reference images not displayed]

FINDINGS: Cardiovascular: Normal heart size. Small pericardial effusion.
Three-vessel coronary atherosclerosis. Mildly atherosclerotic
nonaneurysmal thoracic aorta. Normal caliber pulmonary arteries. No
central pulmonary emboli.

Mediastinum/Nodes: Mildly heterogeneous thyroid gland without
discrete thyroid nodules. Unremarkable esophagus. No pathologically
enlarged axillary, mediastinal or hilar lymph nodes.

Lungs/Pleura: No pneumothorax. No pleural effusion. Moderate
centrilobular and paraseptal emphysema. No acute consolidative
airspace disease or lung masses. Numerous (at least 12) solid
pulmonary nodules scattered in both lungs, largest 7 mm in the right
upper lobe (series 3/image 44) and 6 mm in the left lower lobe
(series 3/image 82).

Upper abdomen: Partially visualized heterogeneously enhancing left
renal pelvis mass measuring 6.5 cm (series 2/image 172), increased
from 5.8 cm on 03/09/2018 CT. Partially visualized infiltrative left
para-aortic adenopathy measuring at least 5.0 cm short axis diameter
(series 2/image 177), increased from 3.7 cm. Nonspecific
hypervascular 1.0 cm focus in the segment 3 left liver lobe (series
2/image 169), not definitely seen on prior CT, possibly due to
differences in contrast timing.

Musculoskeletal: No aggressive appearing focal osseous lesions.
Symmetric mild gynecomastia. Mild thoracic spondylosis.
IMPRESSION: 1. Numerous (at least 12) solid subcentimeter pulmonary nodules in
both lungs, suspicious for pulmonary metastases.
2. Interval growth of malignant left renal pelvis mass.
3. Interval growth of left para-aortic infiltrative metastatic
adenopathy.
4. Small pericardial effusion.
5. Three-vessel coronary atherosclerosis.

Aortic Atherosclerosis ([id number]) and Emphysema ([id number].7).
